# Patient Record
Sex: Female | Born: 1970 | Race: White | Hispanic: No | Marital: Married | Smoking: Never smoker
Health system: Southern US, Community
[De-identification: ages and names within clinical notes are randomized; demographics above are authoritative.]

## PROBLEM LIST (undated history)

## (undated) DIAGNOSIS — M1990 Unspecified osteoarthritis, unspecified site: Secondary | ICD-10-CM

## (undated) DIAGNOSIS — F419 Anxiety disorder, unspecified: Secondary | ICD-10-CM

## (undated) DIAGNOSIS — R569 Unspecified convulsions: Secondary | ICD-10-CM

## (undated) DIAGNOSIS — G8929 Other chronic pain: Secondary | ICD-10-CM

## (undated) DIAGNOSIS — F32A Depression, unspecified: Secondary | ICD-10-CM

## (undated) DIAGNOSIS — K529 Noninfective gastroenteritis and colitis, unspecified: Secondary | ICD-10-CM

## (undated) DIAGNOSIS — G35D Multiple sclerosis, unspecified: Secondary | ICD-10-CM

## (undated) DIAGNOSIS — M797 Fibromyalgia: Secondary | ICD-10-CM

## (undated) DIAGNOSIS — E559 Vitamin D deficiency, unspecified: Secondary | ICD-10-CM

## (undated) DIAGNOSIS — G43909 Migraine, unspecified, not intractable, without status migrainosus: Secondary | ICD-10-CM

## (undated) DIAGNOSIS — M545 Low back pain, unspecified: Secondary | ICD-10-CM

## (undated) DIAGNOSIS — G35 Multiple sclerosis: Secondary | ICD-10-CM

## (undated) DIAGNOSIS — K219 Gastro-esophageal reflux disease without esophagitis: Secondary | ICD-10-CM

## (undated) DIAGNOSIS — F329 Major depressive disorder, single episode, unspecified: Secondary | ICD-10-CM

## (undated) HISTORY — DX: Low back pain: M54.5

## (undated) HISTORY — DX: Vitamin D deficiency, unspecified: E55.9

## (undated) HISTORY — DX: Major depressive disorder, single episode, unspecified: F32.9

## (undated) HISTORY — DX: Anxiety disorder, unspecified: F41.9

## (undated) HISTORY — DX: Noninfective gastroenteritis and colitis, unspecified: K52.9

## (undated) HISTORY — DX: Low back pain, unspecified: M54.50

## (undated) HISTORY — DX: Multiple sclerosis: G35

## (undated) HISTORY — PX: KNEE SURGERY: SHX244

## (undated) HISTORY — DX: Unspecified osteoarthritis, unspecified site: M19.90

## (undated) HISTORY — PX: BACK SURGERY: SHX140

## (undated) HISTORY — DX: Depression, unspecified: F32.A

## (undated) HISTORY — DX: Multiple sclerosis, unspecified: G35.D

## (undated) HISTORY — DX: Other chronic pain: G89.29

## (undated) HISTORY — DX: Migraine, unspecified, not intractable, without status migrainosus: G43.909

## (undated) HISTORY — DX: Unspecified convulsions: R56.9

---

## 2000-12-15 ENCOUNTER — Observation Stay (HOSPITAL_COMMUNITY): Admission: AD | Admit: 2000-12-15 | Discharge: 2000-12-16 | Payer: Self-pay | Admitting: Obstetrics and Gynecology

## 2000-12-28 ENCOUNTER — Observation Stay (HOSPITAL_COMMUNITY): Admission: AD | Admit: 2000-12-28 | Discharge: 2000-12-29 | Payer: Self-pay | Admitting: Obstetrics and Gynecology

## 2001-01-04 ENCOUNTER — Other Ambulatory Visit: Admission: RE | Admit: 2001-01-04 | Discharge: 2001-01-04 | Payer: Self-pay | Admitting: Obstetrics and Gynecology

## 2001-01-08 ENCOUNTER — Ambulatory Visit (HOSPITAL_COMMUNITY): Admission: RE | Admit: 2001-01-08 | Discharge: 2001-01-08 | Payer: Self-pay | Admitting: Obstetrics and Gynecology

## 2001-07-12 ENCOUNTER — Ambulatory Visit (HOSPITAL_COMMUNITY): Admission: RE | Admit: 2001-07-12 | Discharge: 2001-07-12 | Payer: Self-pay | Admitting: Obstetrics and Gynecology

## 2001-07-15 ENCOUNTER — Ambulatory Visit (HOSPITAL_COMMUNITY): Admission: AD | Admit: 2001-07-15 | Discharge: 2001-07-15 | Payer: Self-pay | Admitting: Obstetrics and Gynecology

## 2001-07-19 ENCOUNTER — Ambulatory Visit (HOSPITAL_COMMUNITY): Admission: AD | Admit: 2001-07-19 | Discharge: 2001-07-19 | Payer: Self-pay | Admitting: Obstetrics and Gynecology

## 2001-07-22 ENCOUNTER — Ambulatory Visit (HOSPITAL_COMMUNITY): Admission: RE | Admit: 2001-07-22 | Discharge: 2001-07-22 | Payer: Self-pay | Admitting: Obstetrics and Gynecology

## 2001-07-25 ENCOUNTER — Inpatient Hospital Stay (HOSPITAL_COMMUNITY): Admission: AD | Admit: 2001-07-25 | Discharge: 2001-07-28 | Payer: Self-pay | Admitting: Obstetrics and Gynecology

## 2001-10-11 ENCOUNTER — Ambulatory Visit (HOSPITAL_COMMUNITY): Admission: RE | Admit: 2001-10-11 | Discharge: 2001-10-11 | Payer: Self-pay | Admitting: Obstetrics & Gynecology

## 2003-12-02 ENCOUNTER — Emergency Department (HOSPITAL_COMMUNITY): Admission: EM | Admit: 2003-12-02 | Discharge: 2003-12-02 | Payer: Self-pay | Admitting: Emergency Medicine

## 2003-12-25 ENCOUNTER — Ambulatory Visit (HOSPITAL_COMMUNITY): Admission: RE | Admit: 2003-12-25 | Discharge: 2003-12-25 | Payer: Self-pay | Admitting: Orthopaedic Surgery

## 2006-03-25 ENCOUNTER — Ambulatory Visit (HOSPITAL_COMMUNITY): Admission: RE | Admit: 2006-03-25 | Discharge: 2006-03-25 | Payer: Self-pay | Admitting: Neurology

## 2007-01-20 ENCOUNTER — Ambulatory Visit (HOSPITAL_COMMUNITY): Admission: RE | Admit: 2007-01-20 | Discharge: 2007-01-20 | Payer: Self-pay | Admitting: Neurology

## 2007-01-22 ENCOUNTER — Emergency Department (HOSPITAL_COMMUNITY): Admission: EM | Admit: 2007-01-22 | Discharge: 2007-01-22 | Payer: Self-pay | Admitting: Emergency Medicine

## 2007-07-12 ENCOUNTER — Emergency Department (HOSPITAL_COMMUNITY): Admission: EM | Admit: 2007-07-12 | Discharge: 2007-07-13 | Payer: Self-pay | Admitting: Emergency Medicine

## 2007-07-20 ENCOUNTER — Emergency Department (HOSPITAL_COMMUNITY): Admission: EM | Admit: 2007-07-20 | Discharge: 2007-07-20 | Payer: Self-pay | Admitting: Emergency Medicine

## 2008-07-09 ENCOUNTER — Emergency Department (HOSPITAL_COMMUNITY): Admission: EM | Admit: 2008-07-09 | Discharge: 2008-07-09 | Payer: Self-pay | Admitting: Emergency Medicine

## 2008-11-22 ENCOUNTER — Ambulatory Visit (HOSPITAL_COMMUNITY): Admission: RE | Admit: 2008-11-22 | Discharge: 2008-11-22 | Payer: Self-pay | Admitting: Neurology

## 2009-09-16 ENCOUNTER — Ambulatory Visit (HOSPITAL_COMMUNITY): Admission: RE | Admit: 2009-09-16 | Discharge: 2009-09-16 | Payer: Self-pay | Admitting: Neurology

## 2010-03-12 ENCOUNTER — Encounter: Admission: RE | Admit: 2010-03-12 | Discharge: 2010-03-12 | Payer: Self-pay | Admitting: Neurosurgery

## 2010-05-17 ENCOUNTER — Encounter: Payer: Self-pay | Admitting: Neurosurgery

## 2010-05-19 ENCOUNTER — Encounter: Payer: Self-pay | Admitting: Neurology

## 2010-05-29 ENCOUNTER — Other Ambulatory Visit (HOSPITAL_COMMUNITY): Payer: Self-pay

## 2010-06-02 ENCOUNTER — Inpatient Hospital Stay (HOSPITAL_COMMUNITY)
Admission: RE | Admit: 2010-06-02 | Discharge: 2010-06-04 | DRG: 460 | Disposition: A | Discharge status: Home / Self Care | Payer: Medicaid Other | Source: Ambulatory Visit | Attending: Neurosurgery | Admitting: Neurosurgery

## 2010-06-02 ENCOUNTER — Inpatient Hospital Stay (HOSPITAL_COMMUNITY): Payer: Medicaid Other

## 2010-06-02 ENCOUNTER — Encounter (HOSPITAL_COMMUNITY): Payer: Medicaid Other

## 2010-06-02 DIAGNOSIS — M5137 Other intervertebral disc degeneration, lumbosacral region: Principal | ICD-10-CM | POA: Diagnosis present

## 2010-06-02 DIAGNOSIS — M51379 Other intervertebral disc degeneration, lumbosacral region without mention of lumbar back pain or lower extremity pain: Principal | ICD-10-CM | POA: Diagnosis present

## 2010-06-02 LAB — TYPE AND SCREEN: Antibody Screen: NEGATIVE

## 2010-06-02 LAB — BASIC METABOLIC PANEL
CO2: 28 mEq/L (ref 19–32)
Chloride: 106 mEq/L (ref 96–112)
GFR calc Af Amer: >60 mL/min (ref >60)
Potassium: 4.2 mEq/L (ref 3.5–5.1)
Sodium: 140 mEq/L (ref 135–145)

## 2010-06-02 LAB — SURGICAL PCR SCREEN
MRSA, PCR: NEGATIVE
Staphylococcus aureus: NEGATIVE

## 2010-06-02 LAB — CBC
Hemoglobin: 12.4 g/dL (ref 12.0–15.0)
MCH: 30 pg (ref 26.0–34.0)
RBC: 4.14 MIL/uL (ref 3.87–5.11)

## 2010-06-03 LAB — BASIC METABOLIC PANEL
Calcium: 7.7 mg/dL — ABNORMAL LOW (ref 8.4–10.5)
Creatinine, Ser: 0.71 mg/dL (ref 0.4–1.2)
GFR calc Af Amer: >60 mL/min (ref >60)
GFR calc non Af Amer: >60 mL/min (ref >60)
Glucose, Bld: 104 mg/dL — ABNORMAL HIGH (ref 70–99)
Sodium: 138 mEq/L (ref 135–145)

## 2010-06-03 LAB — CBC
MCHC: 33.7 g/dL (ref 30.0–36.0)
RDW: 13.2 % (ref 11.5–15.5)

## 2010-06-03 NOTE — Op Note (Signed)
Dawn Richard, Dawn Richard                   ACCOUNT NO.:  [id number]  MEDICAL RECORD NO.:  [id number]           PATIENT TYPE:  I  LOCATION:  3022                         FACILITY:  [facility]  PHYSICIAN:  Cristi Loron, M.D.DATE OF BIRTH:  1970-06-13  DATE OF PROCEDURE:  06/02/2010 DATE OF DISCHARGE:                              OPERATIVE REPORT   BRIEF HISTORY:  The patient is a 40 year old white female, who suffered from chronic back pain.  She has failed nonsurgical management.  She was worked up with a lumbar MRI and a lumbar diskogram, which demonstrated the patient had disk degeneration with concordant pain at L4-5.  I discussed the various treatment options with the patient including surgery.  She has weighed the risks, benefits, and alternatives of surgery and desired to proceed with L4-5 anterior lumbar interbody fusion and placement of the interbody prosthesis and anterior instrumentation.  PREOPERATIVE DIAGNOSES: 1. L4-5 degenerative disk disease. 2. Lumbago. 3. Lumbar radiculopathy.  POSTOPERATIVE DIAGNOSES: 1. L4-5 degenerative disk disease. 2. Lumbago. 3. Lumbar radiculopathy.  PROCEDURES: 1. L4-5 anterior lumbar interbody fusion with local morselized     autograft bone and Actifuse bone graft extender with bone     morphogenic protein. 2. Insertion of L4-5 interbody prosthesis (Medtronic PEEK interbody     prosthesis). 3. Anterior screw fixation, L4-5 with Medtronic titanium screws.  SURGEONS:  Cristi Loron, MD and Larina Earthly, MD  ASSISTANT:  Stefani Dama, MD  ANESTHESIA:  General endotracheal.  ESTIMATED BLOOD LOSS:  100 mL.  SPECIMENS:  None.  DRAINS:  None.  COMPLICATIONS:  None.  DESCRIPTION OF PROCEDURE:  The patient was brought to the operating room by the Anesthesia Team.  General endotracheal anesthesia was induced. The patient remained in supine position.  Her abdomen was then prepared with Betadine scrub and Betadine solution.   Sterile drapes were applied.  Dr. Arbie Cookey did the exposure surgery.  For this part of the operation, please refer to his typed operative note.  Once Dr. Arbie Cookey had provided the anterior exposure to the L4-5 intervertebral disk and placed the self-retaining retractors, I took over the operation.  We confirmed that we were indeed at the L4-5 intervertebral disk with intraoperative fluoroscopy.  I then incised the L4-5 intervertebral disk with a 15-blade scalpel.  We performed a partial intervertebral diskectomy with the pituitary forceps and then used the various curettes to clear soft tissue from the vertebral endplates and the lateral recesses and L4-5.  After we were satisfied with the diskectomy, we used trial spacers and determined to use a 12-mm 8 degrees medium prosthesis.  We prefilled the prosthesis with combination of local morselized autograft bone we obtained during the diskectomy as well as Actifuse bone graft extender and bone morphogenic protein-soaked collagen sponges.  We then inserted the prosthesis into the interspace under fluoroscopic guidance.  We then used a drill guide and the awl to create pilot holes one centrally into the L5 interbody and two into the L4 vertebral body.  We then inserted a 20-mm titanium screws through the prosthesis.  We secured the screws and prosthesis,  and tightened them appropriately.  We then again confirmed the good position of this with AP and lateral fluoroscopy.  We then obtained hemostasis with bipolar electrocautery.  We irrigated the wound out with bacitracin solution.  We then removed the retractor. We then reapproximated the patient's anterior rectus sheath with a running 0 Vicryl suture and the subcutaneous tissue with interrupted 2-0 Vicryl suture and the skin with Steri-Strips and Benzoin.  The wound was then coated with bacitracin ointment.  A sterile dressing was applied. The drapes were removed,  and the patient was  subsequently extubated by the Anesthesia Team and transported to the Post Anesthesia Care Unit in stable condition.  All sponge, instrument, and needle counts were correct at the end of this case.     Cristi Loron, M.D.     JDJ/MEDQ  D:  06/02/2010  T:  06/03/2010  Job:  [id number]  Electronically Signed by Tressie Stalker M.D. on 06/03/2010 01:46:24 PM

## 2010-06-04 LAB — BASIC METABOLIC PANEL
BUN: 2 mg/dL — ABNORMAL LOW (ref 6–23)
CO2: 25 mEq/L (ref 19–32)
Chloride: 108 mEq/L (ref 96–112)
GFR calc non Af Amer: >60 mL/min (ref >60)
Glucose, Bld: 110 mg/dL — ABNORMAL HIGH (ref 70–99)
Potassium: 4.4 mEq/L (ref 3.5–5.1)

## 2010-06-09 NOTE — Op Note (Addendum)
  NAMERHESA, Dawn Richard                   ACCOUNT NO.:  [id number]  MEDICAL RECORD NO.:  [id number]           PATIENT TYPE:  I  LOCATION:  3022                         FACILITY:  [facility]  PHYSICIAN:  Larina Earthly, M.D.    DATE OF BIRTH:  [birth year]  DATE OF PROCEDURE:  06/02/2010 DATE OF DISCHARGE:                              OPERATIVE REPORT   PREOPERATIVE DIAGNOSIS:  Lumbar disk disease.  POSTOPERATIVE DIAGNOSIS:  Lumbar disk disease.  PROCEDURE:  Anterior exposure for L4-5 disk fusion which will be dictated as separate note by Dr. Tressie Stalker.  SURGEONS FOR EXPOSURE:  Dr. Arbie Cookey and Dr. Lovell Sheehan.  ANESTHESIA:  General endotracheal.  COMPLICATIONS:  None.  HISTORY OF PRESENT ILLNESS:  The patient is a 40 year old white female with progressive severe L4-5 disk disease.  She was seen preoperatively by Dr. Lovell Sheehan who recommended anterior approach for lumbar fusion.  I discussed this with Dawn Richard and her husband explaining my role inexposure.  I explained mobilization of the intraperitoneal contents, left ureter, iliac vessels.  I also discussed potential injury of these structures.  The patient understands and wished to proceed.  PROCEDURE IN DETAIL:  The patient was taken to the operating room, placed in supine position where the area then was prepped and draped in sterile fashion.  Lateral C-arm projection revealed the level of L4-5 disk and this was marked on the skin.  A incision was made just below the level of the umbilicus from the midline laterally.  The subcutaneous fat was opened in line with a skin incision with electrocautery to expose the anterior rectus sheath.  The anterior rectus sheath was also opened in line with a skin incision.  The rectus muscle was circumferentially mobilized proximally and distally.  The retroperitoneal space was entered bluntly below the level of semilunar line and the intraperitoneal contents were reflected to the right.  The posterior  rectus sheath was opened laterally to give continued mobilization.  The psoas muscle was identified and dissection plane anterior to this was developed.  The left iliac vessels were mobilized to the right.  The iliolumbar vein was identified and had an Jocelynne Duquette branching and was ligated proximally and distally with 2-0 silk sutures and divided.  The blunt dissection plane was continued to be mobilized to mobilize the vessels.  The brow retractor was brought onto the field and the 150 reverse lip blades were placed to the right and left of the L4-5 disk.  Malleable retractors were placed in the [place] tractor for inferior and superior retraction.  After adequate mobilization and placement of tractor, Dr. Lovell Sheehan performed the lumbar surgery which will be dictated as a separate note.     Larina Earthly, M.D.     TFE/MEDQ  D:  06/02/2010  T:  06/03/2010  Job:  [id number]  Electronically Signed by Dalonte Hardage M.D. on 06/09/2010 07:41:40 PM

## 2010-08-07 LAB — COMPREHENSIVE METABOLIC PANEL
ALT: 10 U/L (ref 0–35)
AST: 15 U/L (ref 0–37)
Alkaline Phosphatase: 85 U/L (ref 39–117)
CO2: 31 mEq/L (ref 19–32)
GFR calc Af Amer: >60 mL/min (ref >60)
GFR calc non Af Amer: >60 mL/min (ref >60)
Glucose, Bld: 92 mg/dL (ref 70–99)
Potassium: 2.9 mEq/L — ABNORMAL LOW (ref 3.5–5.1)
Sodium: 138 mEq/L (ref 135–145)
Total Protein: 7.1 g/dL (ref 6.0–8.3)

## 2010-08-07 LAB — DIFFERENTIAL
Basophils Relative: 1 % (ref 0–1)
Eosinophils Absolute: 0 10*3/uL (ref 0.0–0.7)
Eosinophils Relative: 0 % (ref 0–5)
Lymphs Abs: 1.6 10*3/uL (ref 0.7–4.0)
Monocytes Relative: 8 % (ref 3–12)
Neutrophils Relative %: 76 % (ref 43–77)

## 2010-08-07 LAB — URINE MICROSCOPIC-ADD ON

## 2010-08-07 LAB — CBC
Hemoglobin: 13.1 g/dL (ref 12.0–15.0)
RBC: 4.21 MIL/uL (ref 3.87–5.11)
WBC: 10.2 10*3/uL (ref 4.0–10.5)

## 2010-08-07 LAB — URINALYSIS, ROUTINE W REFLEX MICROSCOPIC
Bilirubin Urine: NEGATIVE
Glucose, UA: NEGATIVE mg/dL
Hgb urine dipstick: NEGATIVE
Specific Gravity, Urine: 1.015 (ref 1.005–1.030)
pH: 6.5 (ref 5.0–8.0)

## 2010-09-12 NOTE — Op Note (Signed)
[facility]  Patient:    Dawn Richard, Dawn Richard Visit Number: [id number] MRN: [id number]          Service Type: OBS Location: 4A A427 01 Attending Physician:  Tilda Burrow Dictated by:   Zerita Boers, N.M. Admit Date:  07/25/2001                             Operative Report  DELIVERY SUMMARY  DATE OF DELIVERY:  July 26, 2001  LENGTH OF FIRST-STAGE LABOR:  Fourteen hours.  LENGTH OF SECOND-STAGE LABOR:  Seventeen minutes.  LENGTH OF THIRD-STAGE LABOR:  Thirteen minutes.  DELIVERY TIME:  2:17 a.m. on July 26, 2001.  DELIVERY NOTE:  The patient had a Kiwi-assisted vacuum delivery from a +2/+3 station due to maternal fatigue and inability to push from epidural anesthesia.  Kiwi vacuum was placed on fetal head and Kiwi vacuum pumped up to the green marker and patient was assisted x4 uterine contractions with spontaneously delivery of head.  Upon delivery of head, there was an occult hand and a nuchal cord.  Cord was loosened and infant easily slipped thorough. Upon delivery, infant had good tone, vigorous cry and pinked up quite readily. Cord was clamped and infant placed upon mothers abdomen after being dried. Upon inspection, there was a second degree perineal laceration which was repaired with two interrupted sutures and subcuticular to close with 2-0 Vicryl.  Anesthesia was epidural anesthesia.  Placenta was delivered spontaneously via Schultze mechanism with membranes intact.  Three-vessel cord was noted upon inspection.  Good hemostasis was obtained but due to prolonged Pitocin induction, Hemabate 0.5 ampule was given as prophylaxis to prevent postpartum hemorrhage.  Estimated blood loss was approximately 350 cc.  ADDENDUM:  Approximately one hour prior to delivery, patient began to chill and had an oral temperature of 100.6; at that time, the patient was started on ampicillin 2 g pushes and 1 g q.4h. and I will follow these throughout  the night.  Patient and infant were both stabilized and transferred up to the postpartum unit in good condition. Dictated by:   Zerita Boers, N.M. Attending Physician:  Tilda Burrow DD:  07/26/01 TD:  07/27/01 Job: [id number]

## 2010-09-12 NOTE — Op Note (Signed)
[facility]  Patient:    Dawn Richard, Dawn Richard Visit Number: [id number] MRN: [id number]          Service Type: DSU Location: DAY Attending Physician:  Lazaro Arms Dictated by:   Duane Lope, M.D. Proc. Date: 10/11/01 Admit Date:  10/11/2001                             Operative Report  PREOPERATIVE DIAGNOSIS:  Multiparous female, desires permanent sterilization.  POSTOPERATIVE DIAGNOSIS:  Multiparous female, desires permanent sterilization.  PROCEDURE:  Laparoscopic bilateral tubal ligation using electrocautery.  SURGEON:  Duane Lope, M.D.  ANESTHESIA:  General endotracheal.  FINDINGS:  The patient had a normal pelvis with normal uterus, tubes and ovaries.  DESCRIPTION OF OPERATION:  The patient was taken to the operating room, placed in supine position and underwent general endotracheal anesthesia.  She was then placed in the dorsal lithotomy position, prepped and draped in the usual sterile fashion and a Hulka tenaculum was placed in the uterus for manipulation.  Marcaine 0.5% was injected in the area of the incision.  The incision was made and carried down sharply to the rectus fascia.  The direct-vision pistol-grip trocar was then used and placed into the peritoneal cavity under direct visualization without difficulty.  Peritoneal cavity was insufflated, confirmed with video laparoscope.  Attention was turned to the pelvis.  The fallopian tubes were identified bilaterally.  Electrocautery unit was used and both tubes were burned to no resistance and beyond, a 3.5-cm segment in the distal isthmic and ampullary region of each tube, and again, it was approximately a 3-cm segment bilaterally.  There was good hemostasis.  The rest of the pelvis was normal.  The instruments were removed, the gas was allowed to escape.  The fascia was closed with a single 2-0 Vicryl suture.  The skin was closed using 3-0 Vicryl in a subcuticular fashion.  The patient  tolerated the procedure well, she experienced no blood loss and was taken to the recovery room in good and stable condition.  All counts were correct. Dictated by:   Duane Lope, M.D. Attending Physician:  Lazaro Arms DD:  10/11/01 TD:  10/12/01 Job: [id number]

## 2010-09-12 NOTE — Op Note (Signed)
[facility]  Patient:    Dawn Richard, Dawn Richard Visit Number: [id number] MRN: [id number]          Service Type: OBS Location: 4A A427 01 Attending Physician:  Tilda Burrow Dictated by:   Duane Lope, M.D. Proc. Date: 07/25/01 Admit Date:  07/25/2001                             Operative Report  PROCEDURE:  Epidural catheter placement.  OBSTETRICIAN:  Duane Lope, M.D.  INDICATION:  Patient is in active phase of the first stage of labor.  She is on Pitocin induction.  Her cervix is 5 cm.  She had received two doses of Nubain and was continuing to have a significant amount of pain, she says a level of 7 or 8.  She signed the epidural consult and proceeded.  DESCRIPTION OF PROCEDURE:  I evaluated the spine; there was no infection or trauma to the area of the lumbosacral spine.  I palpated the iliac crest and I found the midline and went one space above to the L2-L3 interspace.  I marked this area, I prepped it and draped, I injected 1% lidocaine as a skin wheal and then introduced a 17-gauge Tuohy needle -- it took three passes -- and found the epidural space with loss-of-resistance technique.  I injected 10 cc of 0.125% bupivacaine plain at 2222.  I then fed the catheter to a depth of 12 cm, which was 5 cm into the epidural space, and it had some difficulty feeding initially, but then fed without difficulty.  An additional 10 cc of 0.125% bupivacaine plain were then pushed through the catheter at 2227; there was no return of blood or CSF.  The epidural was then taped down and the pump was hooked up.  A 7 cc bolus was given of 0.125% bupivacaine with 2 mcg of fentanyl and a continuous infusion of 12 cc/hr was instituted.  At about 20 minutes, the patient had a level of T8 and said her pain was down to a 1 to 2. Patient tolerated the procedure well. Dictated by:   Duane Lope, M.D. Attending Physician:  Tilda Burrow DD:  07/25/01 TD:  07/26/01 Job:  [id number]

## 2010-09-12 NOTE — H&P (Signed)
[facility]  Patient:    Dawn Richard, Dawn Richard Visit Number: [id number] MRN: [id number]          Service Type: DSU Location: DAY Attending Physician:  Lazaro Arms Dictated by:   Duane Lope, M.D. Admit Date:  10/11/2001                           History and Physical  PREOPERATIVE HISTORY AND PHYSICAL  HISTORY OF PRESENT ILLNESS:  The patient is a 40 year old gravida 2, now para 2, who is 10 weeks postpartum from a vaginal delivery, who desires permanent sterilization.  The patient understands the permanent nature of the procedure and the failure rate of 1 in 300 and will proceed.  PAST MEDICAL HISTORY: 1. Mild mitral regurgitation, that does not require antibiotics. 2. Headaches.  PAST SURGICAL HISTORY:  Back injury in September 2000, causing chronic back pain.  PAST OBSTETRICAL HISTORY:  Two vaginal deliveries in 1997 and then again in 2003.  MEDICATIONS:  Celexa.  PHYSICAL EXAMINATION:  VITAL SIGNS:  Blood pressure 110/80, weight is 150 pounds.  HEENT:  Unremarkable.  NECK:  Thyroid is normal.  LUNGS:  Clear.  HEART:  Regular rate and rhythm without murmur, regurgitation or gallop.  BREASTS:  Deferred.  ABDOMEN:  Benign.  GENITALIA:  Pelvic is normal, normal uterus, normal involuted.  Ovaries are normal and nontender.  EXTREMITIES:  Warm with no edema.  NEUROLOGIC EXAM:  Grossly intact.  IMPRESSION:  Multiparous female, desires permanent sterilization.  PLAN:  The patient is admitted for a laparoscopic bilateral tubal ligation using electrocautery.  She understands the risks, benefits, indications, alternatives and will proceed. Dictated by:   Duane Lope, M.D. Attending Physician:  Lazaro Arms DD:  10/10/01 TD:  10/11/01 Job: [id number]

## 2010-09-12 NOTE — H&P (Signed)
[facility]  Patient:    Dawn Richard, Dawn Richard Visit Number: [id number] MRN: [id number]          Service Type: OBS Location: 4A A427 01 Attending Physician:  Tilda Burrow Dictated by:   Duane Lope, M.D. Admit Date:  07/25/2001                           History and Physical  HISTORY OF PRESENT ILLNESS:  Dawn Richard is a 40 year old, white female, G2, P1, AB0 with estimated date of delivery August 01, 2001, currently at [redacted] weeks gestation who is admitted for induction of labor secondary to borderline oligohydramnios and borderline intrauterine growth restriction.  The patients cervix is 1-2, 25% and -3 at the time of admission and there is a reactive NST.  She has been getting twice weekly NSTs which have been reactive.  PAST MEDICAL HISTORY: 1. Mild mitral regurgitation. 2. Headaches.  PAST SURGICAL HISTORY:  Back injury in September 2000, causing chronic back pain.  PAST OBSTETRIC HISTORY:  One vaginal delivery in 1997, 7 pounds 6 ounces.  She had gestational diabetes.  MEDICATIONS: 1. Celexa. 2. Prenatal vitamins.  PHYSICAL EXAMINATION:  VITAL SIGNS:  Blood pressure 110/80, weight 182 pounds.  HEENT:  Unremarkable.  NECK:  Thyroid normal.  LUNGS:  Clear.  HEART:  Regular rate and rhythm without murmurs, rubs or gallops.  BREASTS:  Deferred.  ABDOMEN:  Gravid with fundal height 37 cm.  Cervix is stated as above.  EXTREMITIES:  1+ edema.  NEUROLOGIC:  Grossly intact.  IMPRESSION: 1. Intrauterine pregnancy at [redacted] weeks gestation. 2. Borderline intrauterine growth restriction and oligohydramnios. 3. Bishop score of 5.  PLAN:  The patient is admitted for induction of labor with Pitocin, amniotomy and external fetal monitoring.  She understands the risks, benefits, indications and alternatives and will proceed. Dictated by:   Duane Lope, M.D. Attending Physician:  Tilda Burrow DD:  07/25/01 TD:  07/25/01 Job: [id number]

## 2010-10-14 ENCOUNTER — Ambulatory Visit (HOSPITAL_COMMUNITY)
Admission: RE | Admit: 2010-10-14 | Discharge: 2010-10-14 | Disposition: A | Discharge status: Home / Self Care | Payer: Medicaid Other | Source: Ambulatory Visit | Attending: Neurosurgery | Admitting: Neurosurgery

## 2010-10-14 DIAGNOSIS — M545 Low back pain, unspecified: Secondary | ICD-10-CM | POA: Insufficient documentation

## 2010-10-14 DIAGNOSIS — IMO0001 Reserved for inherently not codable concepts without codable children: Secondary | ICD-10-CM | POA: Insufficient documentation

## 2010-10-14 DIAGNOSIS — R262 Difficulty in walking, not elsewhere classified: Secondary | ICD-10-CM | POA: Insufficient documentation

## 2010-10-14 DIAGNOSIS — M6281 Muscle weakness (generalized): Secondary | ICD-10-CM | POA: Insufficient documentation

## 2010-10-27 ENCOUNTER — Ambulatory Visit (HOSPITAL_COMMUNITY): Payer: Medicaid Other | Admitting: Physical Therapy

## 2011-01-19 LAB — BASIC METABOLIC PANEL
CO2: 24
Calcium: 9.6
Chloride: 102
GFR calc Af Amer: >60
Glucose, Bld: 111 — ABNORMAL HIGH
Sodium: 135

## 2011-01-19 LAB — DIFFERENTIAL
Basophils Absolute: 0.1
Basophils Relative: 1
Eosinophils Absolute: 0.1
Eosinophils Relative: 1
Monocytes Absolute: 0.7
Monocytes Relative: 8

## 2011-01-19 LAB — CBC
HCT: 40.4
Hemoglobin: 14
MCHC: 34.7
MCV: 90.7
RBC: 4.45
RDW: 13

## 2011-01-19 LAB — POCT CARDIAC MARKERS: Myoglobin, poc: 31.3

## 2011-01-19 LAB — URINALYSIS, ROUTINE W REFLEX MICROSCOPIC
Hgb urine dipstick: NEGATIVE
Nitrite: NEGATIVE
Specific Gravity, Urine: 1.02
Urobilinogen, UA: 1
pH: 6.5

## 2011-01-19 LAB — POTASSIUM: Potassium: 3.3 — ABNORMAL LOW

## 2011-01-19 LAB — BLOOD GAS, ARTERIAL
Acid-base deficit: 0.1
Bicarbonate: 23.2
O2 Saturation: 94.8
Patient temperature: 37
TCO2: 20.4
pH, Arterial: 7.463 — ABNORMAL HIGH

## 2011-02-05 LAB — CBC
Hemoglobin: 12.9
RBC: 4.33
WBC: 9.5

## 2011-02-05 LAB — URINALYSIS, ROUTINE W REFLEX MICROSCOPIC
Glucose, UA: NEGATIVE
Hgb urine dipstick: NEGATIVE
Specific Gravity, Urine: 1.015
pH: 7

## 2011-02-05 LAB — BLOOD GAS, ARTERIAL
Acid-base deficit: 1.4
O2 Content: 2
O2 Saturation: 99.3
pO2, Arterial: 163 — ABNORMAL HIGH

## 2011-02-05 LAB — BASIC METABOLIC PANEL
Calcium: 8.9
Chloride: 103
Creatinine, Ser: 0.68
GFR calc Af Amer: >60
Sodium: 137

## 2011-02-05 LAB — DIFFERENTIAL
Lymphs Abs: 2.3
Monocytes Relative: 6
Neutro Abs: 6.1
Neutrophils Relative %: 64

## 2011-02-05 LAB — URINE MICROSCOPIC-ADD ON

## 2012-02-08 ENCOUNTER — Other Ambulatory Visit (HOSPITAL_COMMUNITY): Payer: Self-pay | Admitting: Neurosurgery

## 2012-02-08 DIAGNOSIS — M549 Dorsalgia, unspecified: Secondary | ICD-10-CM

## 2012-02-09 ENCOUNTER — Ambulatory Visit (HOSPITAL_COMMUNITY): Payer: Medicaid Other

## 2012-02-09 ENCOUNTER — Ambulatory Visit (HOSPITAL_COMMUNITY)
Admission: RE | Admit: 2012-02-09 | Discharge: 2012-02-09 | Disposition: A | Discharge status: Home / Self Care | Payer: Medicaid Other | Source: Ambulatory Visit | Attending: Neurosurgery | Admitting: Neurosurgery

## 2012-02-09 DIAGNOSIS — M545 Low back pain, unspecified: Secondary | ICD-10-CM | POA: Insufficient documentation

## 2012-02-09 DIAGNOSIS — R209 Unspecified disturbances of skin sensation: Secondary | ICD-10-CM | POA: Insufficient documentation

## 2012-02-09 DIAGNOSIS — M5126 Other intervertebral disc displacement, lumbar region: Secondary | ICD-10-CM | POA: Insufficient documentation

## 2012-02-09 DIAGNOSIS — M5124 Other intervertebral disc displacement, thoracic region: Secondary | ICD-10-CM | POA: Insufficient documentation

## 2012-02-09 DIAGNOSIS — M549 Dorsalgia, unspecified: Secondary | ICD-10-CM

## 2012-02-09 DIAGNOSIS — M79609 Pain in unspecified limb: Secondary | ICD-10-CM | POA: Insufficient documentation

## 2012-02-09 MED ORDER — GADOBENATE DIMEGLUMINE 529 MG/ML IV SOLN
10.0000 mL | Freq: Once | INTRAVENOUS | Status: AC | PRN
  Administered 2012-02-09: 10 mL via INTRAVENOUS

## 2012-03-08 ENCOUNTER — Other Ambulatory Visit (HOSPITAL_COMMUNITY): Payer: Self-pay | Admitting: Neurosurgery

## 2012-03-08 DIAGNOSIS — M549 Dorsalgia, unspecified: Secondary | ICD-10-CM

## 2012-03-09 ENCOUNTER — Ambulatory Visit (HOSPITAL_COMMUNITY): Admission: RE | Admit: 2012-03-09 | Payer: Medicaid Other | Source: Ambulatory Visit

## 2012-03-15 ENCOUNTER — Ambulatory Visit (HOSPITAL_COMMUNITY)
Admission: RE | Admit: 2012-03-15 | Discharge: 2012-03-15 | Disposition: A | Discharge status: Home / Self Care | Payer: Medicaid Other | Source: Ambulatory Visit | Attending: Neurosurgery | Admitting: Neurosurgery

## 2012-03-15 DIAGNOSIS — M549 Dorsalgia, unspecified: Secondary | ICD-10-CM

## 2012-03-15 DIAGNOSIS — M5124 Other intervertebral disc displacement, thoracic region: Secondary | ICD-10-CM | POA: Insufficient documentation

## 2013-10-31 ENCOUNTER — Encounter: Payer: Self-pay | Admitting: Neurology

## 2013-10-31 ENCOUNTER — Ambulatory Visit: Payer: Medicaid Other | Admitting: Neurology

## 2013-10-31 DIAGNOSIS — K529 Noninfective gastroenteritis and colitis, unspecified: Secondary | ICD-10-CM

## 2013-10-31 DIAGNOSIS — M545 Low back pain, unspecified: Secondary | ICD-10-CM | POA: Insufficient documentation

## 2013-10-31 DIAGNOSIS — R0789 Other chest pain: Secondary | ICD-10-CM

## 2013-10-31 DIAGNOSIS — F419 Anxiety disorder, unspecified: Secondary | ICD-10-CM

## 2013-10-31 DIAGNOSIS — R5383 Other fatigue: Secondary | ICD-10-CM | POA: Insufficient documentation

## 2013-10-31 DIAGNOSIS — M549 Dorsalgia, unspecified: Secondary | ICD-10-CM | POA: Insufficient documentation

## 2013-10-31 DIAGNOSIS — G43909 Migraine, unspecified, not intractable, without status migrainosus: Secondary | ICD-10-CM | POA: Insufficient documentation

## 2013-10-31 DIAGNOSIS — R0781 Pleurodynia: Secondary | ICD-10-CM

## 2013-10-31 DIAGNOSIS — M2550 Pain in unspecified joint: Secondary | ICD-10-CM

## 2013-10-31 DIAGNOSIS — R569 Unspecified convulsions: Secondary | ICD-10-CM

## 2013-10-31 DIAGNOSIS — M1990 Unspecified osteoarthritis, unspecified site: Secondary | ICD-10-CM

## 2013-10-31 DIAGNOSIS — E559 Vitamin D deficiency, unspecified: Secondary | ICD-10-CM

## 2013-11-08 ENCOUNTER — Ambulatory Visit (INDEPENDENT_AMBULATORY_CARE_PROVIDER_SITE_OTHER): Payer: Medicaid Other | Admitting: Neurology

## 2013-11-08 ENCOUNTER — Encounter: Payer: Self-pay | Admitting: Neurology

## 2013-11-08 ENCOUNTER — Encounter (INDEPENDENT_AMBULATORY_CARE_PROVIDER_SITE_OTHER): Payer: Self-pay

## 2013-11-08 VITALS — BP 98/71 | HR 77 | Ht 63.0 in | Wt 164.0 lb

## 2013-11-08 DIAGNOSIS — G35D Multiple sclerosis, unspecified: Secondary | ICD-10-CM | POA: Insufficient documentation

## 2013-11-08 DIAGNOSIS — G35 Multiple sclerosis: Secondary | ICD-10-CM
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
## 2013-11-09 ENCOUNTER — Telehealth: Payer: Self-pay | Admitting: Neurology

## 2013-11-09 LAB — CBC WITH DIFFERENTIAL
BASOS ABS: 0 10*3/uL (ref 0.0–0.2)
BASOS: 1 %
Eos: 7 %
Eosinophils Absolute: 0.6 10*3/uL — ABNORMAL HIGH (ref 0.0–0.4)
HCT: 33.9 % — ABNORMAL LOW (ref 34.0–46.6)
HEMOGLOBIN: 11.5 g/dL (ref 11.1–15.9)
IMMATURE GRANS (ABS): 0 10*3/uL (ref 0.0–0.1)
Immature Granulocytes: 0 %
LYMPHS: 32 %
Lymphocytes Absolute: 2.5 10*3/uL (ref 0.7–3.1)
MCH: 28 pg (ref 26.6–33.0)
MCHC: 33.9 g/dL (ref 31.5–35.7)
MCV: 83 fL (ref 79–97)
Monocytes Absolute: 0.5 10*3/uL (ref 0.1–0.9)
Monocytes: 7 %
NEUTROS PCT: 53 %
Neutrophils Absolute: 4.1 10*3/uL (ref 1.4–7.0)
Platelets: 343 10*3/uL (ref 150–379)
RBC: 4.11 x10E6/uL (ref 3.77–5.28)
RDW: 13.9 % (ref 12.3–15.4)
WBC: 7.8 10*3/uL (ref 3.4–10.8)

## 2013-11-09 LAB — COMPREHENSIVE METABOLIC PANEL
ALT: 11 IU/L (ref 0–32)
AST: 14 IU/L (ref 0–40)
Albumin/Globulin Ratio: 1.4 (ref 1.1–2.5)
Albumin: 3.9 g/dL (ref 3.5–5.5)
Alkaline Phosphatase: 110 IU/L (ref 39–117)
BUN/Creatinine Ratio: 14 (ref 9–23)
BUN: 11 mg/dL (ref 6–24)
CHLORIDE: 99 mmol/L (ref 97–108)
CO2: 25 mmol/L (ref 18–29)
Calcium: 9.5 mg/dL (ref 8.7–10.2)
Creatinine, Ser: 0.76 mg/dL (ref 0.57–1.00)
GFR calc non Af Amer: 98 mL/min/{1.73_m2} (ref >59)
GFR, EST AFRICAN AMERICAN: 113 mL/min/{1.73_m2} (ref >59)
Globulin, Total: 2.8 g/dL (ref 1.5–4.5)
Glucose: 76 mg/dL (ref 65–99)
POTASSIUM: 4.5 mmol/L (ref 3.5–5.2)
Sodium: 139 mmol/L (ref 134–144)
Total Bilirubin: <0.2 mg/dL (ref 0.0–1.2)
Total Protein: 6.7 g/dL (ref 6.0–8.5)

## 2013-11-09 LAB — TSH: TSH: 7.96 u[IU]/mL — ABNORMAL HIGH (ref 0.450–4.500)

## 2013-11-09 LAB — VITAMIN B12: Vitamin B-12: 821 pg/mL (ref 211–946)

## 2013-11-09 LAB — VARICELLA ZOSTER ANTIBODY, IGG: VARICELLA: 1858 {index} (ref >165)

## 2013-11-09 NOTE — Telephone Encounter (Signed)
Pt called back and I informed her per Dr. Terrace ArabiaYan that the pt's lab work results showed elevated TSH and this could indicate hypothyroidism and that Dr. Terrace ArabiaYan has faxed pt's laboratory evaluation to pt PCP Sherryll BurgerShah and pt should continue to f/u with PCP concerning this issue and that the rest of the lab work results were normal. I advised the pt that if she has any other problems, questions or concerns to call the office. Pt verbalized understanding.

## 2013-11-09 NOTE — Telephone Encounter (Signed)
Called pt and left message informing pt to call our office back to get lab results.

## 2013-11-09 NOTE — Telephone Encounter (Signed)
Please call patient, lab showed elevated TSH could indicate hypothyroidism, I have faxed her laboratory evaluation to her primary care doctor Sherryll Burger, she should continue follow up with her primary care concerning this issue, rest of the laboratory evaluation was normal

## 2013-11-14 ENCOUNTER — Telehealth: Payer: Self-pay | Admitting: Radiology

## 2013-11-14 ENCOUNTER — Other Ambulatory Visit: Payer: Medicaid Other | Admitting: Radiology

## 2013-11-14 NOTE — Telephone Encounter (Signed)
No show for EEG 

## 2013-12-08 ENCOUNTER — Ambulatory Visit
Admission: RE | Admit: 2013-12-08 | Discharge: 2013-12-08 | Disposition: A | Discharge status: Home / Self Care | Payer: Medicaid Other | Source: Ambulatory Visit | Attending: Neurology | Admitting: Neurology

## 2013-12-08 DIAGNOSIS — G35 Multiple sclerosis: Secondary | ICD-10-CM

## 2013-12-08 MED ORDER — GADOBENATE DIMEGLUMINE 529 MG/ML IV SOLN
15.0000 mL | Freq: Once | INTRAVENOUS | Status: AC | PRN
  Administered 2013-12-08: 15 mL via INTRAVENOUS

## 2013-12-12 NOTE — Progress Notes (Signed)
Quick Note:  WID- Dr.Yan is out of the office until 12-14-2013. ______ 

## 2013-12-15 ENCOUNTER — Ambulatory Visit: Payer: Medicaid Other | Admitting: Neurology

## 2014-06-05 ENCOUNTER — Encounter: Payer: Self-pay | Admitting: Neurology

## 2014-06-05 ENCOUNTER — Ambulatory Visit (INDEPENDENT_AMBULATORY_CARE_PROVIDER_SITE_OTHER): Payer: Medicaid Other | Admitting: Neurology

## 2014-06-05 VITALS — BP 131/93 | HR 75 | Ht 63.0 in | Wt 156.0 lb

## 2014-06-05 DIAGNOSIS — R569 Unspecified convulsions: Secondary | ICD-10-CM

## 2014-06-05 DIAGNOSIS — G43009 Migraine without aura, not intractable, without status migrainosus: Secondary | ICD-10-CM

## 2014-06-05 MED ORDER — RIZATRIPTAN BENZOATE 10 MG PO TBDP: 10.0000 mg | ORAL_TABLET | ORAL | Status: DC | PRN

## 2014-06-05 MED ORDER — LAMOTRIGINE 100 MG PO TABS: ORAL_TABLET | ORAL | Status: DC
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
## 2014-06-05 NOTE — Addendum Note (Signed)
Addended by: Levert Feinstein on: 06/05/2014 12:28 PM   Modules accepted: Orders

## 2014-06-12 ENCOUNTER — Other Ambulatory Visit: Payer: Medicaid Other

## 2014-07-11 ENCOUNTER — Telehealth: Payer: Self-pay | Admitting: *Deleted

## 2014-07-11 ENCOUNTER — Ambulatory Visit: Payer: Medicaid Other | Admitting: Neurology

## 2014-07-11 NOTE — Telephone Encounter (Signed)
No showed appointment.

## 2014-07-11 NOTE — Telephone Encounter (Signed)
Multiple no show history for appts and tests. - dismiss per protocol.

## 2014-07-13 ENCOUNTER — Encounter: Payer: Self-pay | Admitting: Neurology

## 2014-10-01 ENCOUNTER — Other Ambulatory Visit: Payer: Self-pay | Admitting: Neurology

## 2014-10-02 NOTE — Telephone Encounter (Signed)
Patient was dismissed 07/13/14

## 2016-02-09 ENCOUNTER — Emergency Department (HOSPITAL_COMMUNITY)
Admission: EM | Admit: 2016-02-09 | Discharge: 2016-02-09 | Disposition: A | Discharge status: Home / Self Care | Payer: Medicaid Other | Attending: Emergency Medicine | Admitting: Emergency Medicine

## 2016-02-09 ENCOUNTER — Emergency Department (HOSPITAL_COMMUNITY): Payer: Medicaid Other

## 2016-02-09 ENCOUNTER — Encounter (HOSPITAL_COMMUNITY): Payer: Self-pay | Admitting: *Deleted

## 2016-02-09 DIAGNOSIS — L03011 Cellulitis of right finger: Secondary | ICD-10-CM

## 2016-02-09 DIAGNOSIS — Y929 Unspecified place or not applicable: Secondary | ICD-10-CM | POA: Diagnosis not present

## 2016-02-09 DIAGNOSIS — W5651XA Bitten by other fish, initial encounter: Secondary | ICD-10-CM | POA: Insufficient documentation

## 2016-02-09 DIAGNOSIS — S6991XA Unspecified injury of right wrist, hand and finger(s), initial encounter: Secondary | ICD-10-CM | POA: Diagnosis present

## 2016-02-09 DIAGNOSIS — Y9389 Activity, other specified: Secondary | ICD-10-CM | POA: Diagnosis not present

## 2016-02-09 DIAGNOSIS — Y999 Unspecified external cause status: Secondary | ICD-10-CM | POA: Diagnosis not present

## 2016-02-09 HISTORY — DX: Fibromyalgia: M79.7

## 2016-02-09 MED ORDER — ACETAMINOPHEN-CODEINE #3 300-30 MG PO TABS
2.0000 | ORAL_TABLET | Freq: Once | ORAL | Status: AC
  Administered 2016-02-09: 2 via ORAL
  Filled 2016-02-09: qty 2

## 2016-02-09 MED ORDER — AMOXICILLIN-POT CLAVULANATE 875-125 MG PO TABS: 1.0000 | ORAL_TABLET | Freq: Two times a day (BID) | ORAL | 0 refills | Status: DC

## 2016-02-09 MED ORDER — AMOXICILLIN-POT CLAVULANATE 875-125 MG PO TABS
1.0000 | ORAL_TABLET | Freq: Once | ORAL | Status: AC
  Administered 2016-02-09: 1 via ORAL
  Filled 2016-02-09: qty 1

## 2016-02-09 MED ORDER — IBUPROFEN 800 MG PO TABS
800.0000 mg | ORAL_TABLET | Freq: Once | ORAL | Status: AC
  Administered 2016-02-09: 800 mg via ORAL
  Filled 2016-02-09: qty 1

## 2016-02-09 MED ORDER — LIDOCAINE HCL (PF) 2 % IJ SOLN
10.0000 mL | Freq: Once | INTRAMUSCULAR | Status: AC
  Administered 2016-02-09: 10 mL
  Filled 2016-02-09: qty 10

## 2016-02-09 MED ORDER — DICLOFENAC SODIUM 75 MG PO TBEC: 75.0000 mg | DELAYED_RELEASE_TABLET | Freq: Two times a day (BID) | ORAL | 0 refills | Status: DC

## 2016-02-09 MED ORDER — ACETAMINOPHEN-CODEINE #3 300-30 MG PO TABS: 1.0000 | ORAL_TABLET | Freq: Four times a day (QID) | ORAL | 0 refills | Status: DC | PRN

## 2016-02-09 NOTE — Discharge Instructions (Signed)
Please cleanse the wound daily with soap and water. Fresh bandage on it daily. Use diclofenac and Augmentin daily with a meal. Use Tylenol codeine as needed every 6 hours for pain. This medication Lenger cause drowsiness, please do not drive, drink alcohol, operate machinery, when taking this medication.

## 2016-02-09 NOTE — ED Provider Notes (Signed)
[facility] DEPT Provider Note   CSN: [id number] Arrival date & time: 02/09/16  1545  By signing my name below, I, Vista Mink, attest that this documentation has been prepared under the direction and in the presence of [employer].  Electronically Signed: Vista Mink, ED Scribe. 02/09/16. 5:06 PM.  History   Chief Complaint Chief Complaint  Patient presents with  . Hand Injury    HPI HPI Comments: Dawn Richard is a 45 y.o. female who presents to the Emergency Department complaining of gradually worsening, shooting right thumb pain, discoloration and swelling that started four days ago. Pt states that she was fishing when a catfish barb stung her right thumb and has increased pain and swelling since then. She also reports decreased sensation at the distal right thumb. Pt is able to move the finger and wrist but with increased pain. She denies any other injuries.  The history is provided by the patient. No language interpreter was used.    Past Medical History:  Diagnosis Date  . Anxiety   . Arthritis   . Back pain   . Depression   . Fatigue   . Fibromyalgia   . Gastroenteritis   . Joint pain   . Low back pain   . Migraine   . MS (multiple sclerosis) (HCC)   . Neck pain   . Rib pain on left side   . Seizure (HCC)   . Seizure (HCC)   . Vitamin D deficiency     Patient Active Problem List   Diagnosis Date Noted  . MS (multiple sclerosis) (HCC)   . Fatigue   . Gastroenteritis   . Low back pain   . Seizure (HCC)   . Anxiety   . Back pain   . Migraine   . Joint pain   . Rib pain on left side   . Vitamin D deficiency   . Arthritis     Past Surgical History:  Procedure Laterality Date  . BACK SURGERY    . KNEE SURGERY Left     OB History    No data available       Home Medications    Prior to Admission medications   Medication Sig Start Date End Date Taking? Authorizing Provider  Diclofenac Potassium (CAMBIA) 50 MG PACK Take by mouth as  directed.    Historical Provider, MD  DULoxetine (CYMBALTA) 30 MG capsule Take 30 mg by mouth 2 (two) times daily.    Historical Provider, MD  lamoTRIgine (LAMICTAL) 100 MG tablet 1/2 tab po bid xone week, then one tab po bid 06/05/14   Levert Feinstein, MD  levETIRAcetam (KEPPRA) 750 MG tablet Take 750 mg by mouth 2 (two) times daily.    Historical Provider, MD  nabumetone (RELAFEN) 750 MG tablet Take 750 mg by mouth 2 (two) times daily.    Historical Provider, MD  omeprazole (PRILOSEC) 20 MG capsule Take 20 mg by mouth daily.    Historical Provider, MD  rizatriptan (MAXALT-MLT) 10 MG disintegrating tablet Take 1 tablet (10 mg total) by mouth as needed. Marlette repeat in 2 hours if needed 06/05/14   Levert Feinstein, MD  tiZANidine (ZANAFLEX) 4 MG capsule Take 4 mg by mouth 3 (three) times daily.    Historical Provider, MD    Family History Family History  Problem Relation Age of Onset  . High blood pressure Mother   . High Cholesterol Mother   . Heart disease Father   . High blood pressure  Father     Social History Social History  Substance Use Topics  . Smoking status: Never Smoker  . Smokeless tobacco: Never Used  . Alcohol use 0.6 oz/week    1 Cans of beer per week     Comment: OCC   Allergies   Bee venom  Review of Systems Review of Systems  Constitutional: Negative for fever.  Musculoskeletal: Positive for arthralgias (right thumb).  Neurological: Positive for numbness (decreased sensation to distal thumb).  All other systems reviewed and are negative.   Physical Exam Updated Vital Signs BP 107/67 (BP Location: Left Arm)   Pulse 75   Temp 98.4 F (36.9 C) (Oral)   Resp 18   Ht 5\' 2"  (1.575 m)   Wt 164 lb (74.4 kg)   LMP 02/11/2012   SpO2 100%   BMI 30.00 kg/m   Physical Exam  Constitutional: She is oriented to person, place, and time. She appears well-developed and well-nourished. No distress.  HENT:  Head: Normocephalic and atraumatic.  Neck: Normal range of motion.    Pulmonary/Chest: Effort normal.  Musculoskeletal: She exhibits tenderness.  No palapble nodes on bicep tricep area. full rom of wrist but with pain. pain at the mp of the index finger and thumb. swelling of distal thumb with a bruise-type discoloration. decreased sensation of the distal right thumb. Full ROM of the elbow and no effusion of the elbow.   Neurological: She is alert and oriented to person, place, and time.  Skin: Skin is warm and dry. She is not diaphoretic.  Psychiatric: She has a normal mood and affect. Judgment normal.  Nursing note and vitals reviewed.   ED Treatments / Results  DIAGNOSTIC STUDIES: Oxygen Saturation is 100% on RA, normal by my interpretation.  COORDINATION OF CARE: 5:05 PM-Will order I&D. Discussed treatment plan with pt at bedside and pt agreed to plan.   Labs (all labs ordered are listed, but only abnormal results are displayed) Labs Reviewed - No data to display  EKG  EKG Interpretation None       Radiology Dg Finger Thumb Right  Result Date: 02/09/2016 CLINICAL DATA:  Right thumb pain since Thursday night EXAM: RIGHT THUMB 2+V COMPARISON:  None. FINDINGS: There is no evidence of fracture or dislocation. There is no evidence of arthropathy or other focal bone abnormality. Soft tissues are unremarkable IMPRESSION: No acute osseous injury of the right thumb. Electronically Signed   By: Elige Ko   On: 02/09/2016 16:21    Procedures .Marland KitchenIncision and Drainage Date/Time: 02/09/2016 6:16 PM Performed by: Ivery Quale Authorized by: Ivery Quale   Consent:    Consent obtained:  Verbal   Consent given by:  Patient   Risks discussed:  Bleeding, pain and infection Location:    Indications for incision and drainage: paronychia.   Location:  Upper extremity   Upper extremity location:  Finger   Finger location:  R thumb Pre-procedure details:    Skin preparation:  Betadine Procedure type:    Complexity:  Simple Procedure details:     Incision types:  Single straight   Incision depth:  Dermal   Scalpel blade:  11   Wound management:  Probed and deloculated   Drainage:  Bloody   Drainage amount:  Scant   Wound treatment:  Wound left open Post-procedure details:    Patient tolerance of procedure:  Tolerated well, no immediate complications   (including critical care time)  Medications Ordered in ED Medications - No data to display  Initial Impression / Assessment and Plan / ED Course  I have reviewed the triage vital signs and the nursing notes.  Pertinent labs & imaging results that were available during my care of the patient were reviewed by me and considered in my medical decision making (see chart for details).  Clinical Course    **I have reviewed nursing notes, vital signs, and all appropriate lab and imaging results for this patient.*  Final Clinical Impressions(s) / ED Diagnoses  Patient received a stick and staying by a catfish barb. She has swelling and inflammation involving the distal right thumb. Incision and drainage carried out with removal small amount of material on. Patient will be treated with Augmentin, diclofenac, and Tylenol codeine. Patient is to return if any changes, problems, or concerns.    Final diagnoses:  None    New Prescriptions New Prescriptions   No medications on file  **I personally performed the services described in this documentation, which was scribed in my presence. The recorded information has been reviewed and is accurate.Ivery Quale*    Deshawnda Acrey, PA-C 02/09/16 1823    Samuel JesterKathleen McManus, DO 02/11/16 2023

## 2016-02-09 NOTE — ED Triage Notes (Signed)
Pt states she was fishing Thursday when a catfish barb stung her right hand thumb. Pt states pain and swelling has increased since then.

## 2016-02-11 ENCOUNTER — Encounter (HOSPITAL_COMMUNITY): Payer: Self-pay | Admitting: Emergency Medicine

## 2016-02-11 ENCOUNTER — Emergency Department (HOSPITAL_COMMUNITY)
Admission: EM | Admit: 2016-02-11 | Discharge: 2016-02-12 | Disposition: A | Discharge status: Home / Self Care | Payer: Medicaid Other | Attending: Emergency Medicine | Admitting: Emergency Medicine

## 2016-02-11 DIAGNOSIS — R197 Diarrhea, unspecified: Secondary | ICD-10-CM | POA: Insufficient documentation

## 2016-02-11 DIAGNOSIS — R112 Nausea with vomiting, unspecified: Secondary | ICD-10-CM | POA: Insufficient documentation

## 2016-02-11 DIAGNOSIS — Z79899 Other long term (current) drug therapy: Secondary | ICD-10-CM | POA: Diagnosis not present

## 2016-02-11 DIAGNOSIS — L02511 Cutaneous abscess of right hand: Secondary | ICD-10-CM | POA: Diagnosis not present

## 2016-02-11 LAB — CBC
HCT: 40.4 % (ref 36.0–46.0)
HEMOGLOBIN: 13.1 g/dL (ref 12.0–15.0)
MCH: 27.9 pg (ref 26.0–34.0)
MCHC: 32.4 g/dL (ref 30.0–36.0)
MCV: 86 fL (ref 78.0–100.0)
Platelets: 335 10*3/uL (ref 150–400)
RBC: 4.7 MIL/uL (ref 3.87–5.11)
RDW: 14.4 % (ref 11.5–15.5)
WBC: 10.5 10*3/uL (ref 4.0–10.5)

## 2016-02-11 LAB — COMPREHENSIVE METABOLIC PANEL
ALBUMIN: 4.3 g/dL (ref 3.5–5.0)
ALK PHOS: 128 U/L — AB (ref 38–126)
ALT: 10 U/L — AB (ref 14–54)
ANION GAP: 9 (ref 5–15)
AST: 17 U/L (ref 15–41)
BUN: 5 mg/dL — AB (ref 6–20)
CALCIUM: 9.7 mg/dL (ref 8.9–10.3)
CO2: 24 mmol/L (ref 22–32)
Chloride: 106 mmol/L (ref 101–111)
Creatinine, Ser: 0.66 mg/dL (ref 0.44–1.00)
GFR calc Af Amer: >60 mL/min (ref >60)
GFR calc non Af Amer: >60 mL/min (ref >60)
GLUCOSE: 103 mg/dL — AB (ref 65–99)
Potassium: 3.4 mmol/L — ABNORMAL LOW (ref 3.5–5.1)
SODIUM: 139 mmol/L (ref 135–145)
Total Bilirubin: 0.7 mg/dL (ref 0.3–1.2)
Total Protein: 8.2 g/dL — ABNORMAL HIGH (ref 6.5–8.1)

## 2016-02-11 LAB — LIPASE, BLOOD: Lipase: 13 U/L (ref 11–51)

## 2016-02-11 LAB — URINALYSIS, ROUTINE W REFLEX MICROSCOPIC
BILIRUBIN URINE: NEGATIVE
Glucose, UA: NEGATIVE mg/dL
HGB URINE DIPSTICK: NEGATIVE
Ketones, ur: NEGATIVE mg/dL
Nitrite: NEGATIVE
PH: 6 (ref 5.0–8.0)
SPECIFIC GRAVITY, URINE: 1.02 (ref 1.005–1.030)

## 2016-02-11 LAB — URINE MICROSCOPIC-ADD ON: RBC / HPF: NONE SEEN RBC/hpf (ref 0–5)

## 2016-02-11 MED ORDER — LIDOCAINE HCL (PF) 2 % IJ SOLN
10.0000 mL | Freq: Once | INTRAMUSCULAR | Status: DC
  Filled 2016-02-11: qty 10

## 2016-02-11 MED ORDER — SODIUM CHLORIDE 0.9 % IV BOLUS (SEPSIS)
1000.0000 mL | Freq: Once | INTRAVENOUS | Status: AC
  Administered 2016-02-11: 1000 mL via INTRAVENOUS

## 2016-02-11 MED ORDER — ONDANSETRON HCL 4 MG/2ML IJ SOLN
4.0000 mg | Freq: Once | INTRAMUSCULAR | Status: AC
  Administered 2016-02-11: 4 mg via INTRAVENOUS
  Filled 2016-02-11: qty 2

## 2016-02-11 MED ORDER — PROMETHAZINE HCL 25 MG PO TABS: 25.0000 mg | ORAL_TABLET | Freq: Four times a day (QID) | ORAL | 0 refills | Status: DC | PRN

## 2016-02-11 MED ORDER — DOXYCYCLINE HYCLATE 100 MG PO CAPS: 100.0000 mg | ORAL_CAPSULE | Freq: Two times a day (BID) | ORAL | 0 refills | Status: DC

## 2016-02-11 MED ORDER — ONDANSETRON HCL 4 MG/2ML IJ SOLN
4.0000 mg | Freq: Once | INTRAMUSCULAR | Status: AC
Start: 2016-02-11 — End: 2016-02-11
  Administered 2016-02-11: 4 mg via INTRAVENOUS
  Filled 2016-02-11: qty 2

## 2016-02-11 MED ORDER — ONDANSETRON HCL 4 MG/2ML IJ SOLN: 4.0000 mg | Freq: Once | INTRAMUSCULAR | Status: DC

## 2016-02-11 NOTE — ED Notes (Signed)
Dr in with Pt

## 2016-02-11 NOTE — ED Notes (Signed)
Pt reports she was given an antibx for finger, took one tablet and then began v/ 2 hours later. Reports 2 episodes of D/ and >5 episodes of V/. Unable to keep food/fluids down.

## 2016-02-11 NOTE — ED Provider Notes (Signed)
[facility] Provider Note   CSN: [id number] Arrival date & time: 02/11/16  1827     History   Chief Complaint Chief Complaint  Patient presents with  . Emesis    HPI Dawn Richard is a 45 y.o. female.  Patient seen in the ED on 02/09/16 for a paronychia of her right thumb. A partial removal of her nail was performed. She returns today with an increasing infection in her thumb and inability to keep fluids down. She has had nausea, vomiting, diarrhea. She was prescribed Augmentin for her thumb. No fever, sweats, chills.      Past Medical History:  Diagnosis Date  . Anxiety   . Arthritis   . Back pain   . Depression   . Fatigue   . Fibromyalgia   . Gastroenteritis   . Joint pain   . Low back pain   . Migraine   . MS (multiple sclerosis) (HCC)   . Neck pain   . Rib pain on left side   . Seizure (HCC)   . Seizure (HCC)   . Vitamin D deficiency     Patient Active Problem List   Diagnosis Date Noted  . MS (multiple sclerosis) (HCC)   . Fatigue   . Gastroenteritis   . Low back pain   . Seizure (HCC)   . Anxiety   . Back pain   . Migraine   . Joint pain   . Rib pain on left side   . Vitamin D deficiency   . Arthritis     Past Surgical History:  Procedure Laterality Date  . BACK SURGERY    . KNEE SURGERY Left     OB History    Gravida Para Term Preterm AB Living   2 2 2          SAB TAB Ectopic Multiple Live Births                   Home Medications    Prior to Admission medications   Medication Sig Start Date End Date Taking? Authorizing Provider  acetaminophen-codeine (TYLENOL #3) 300-30 MG tablet Take 1-2 tablets by mouth every 6 (six) hours as needed for moderate pain. 02/09/16  Yes Ivery Quale, PA-C  amoxicillin-clavulanate (AUGMENTIN) 875-125 MG tablet Take 1 tablet by mouth every 12 (twelve) hours. 02/09/16  Yes Ivery Quale, PA-C  clonazePAM (KLONOPIN) 0.5 MG tablet Take 1 tablet by mouth 3 (three) times daily. 01/15/16  Yes  Historical Provider, MD  diclofenac (VOLTAREN) 75 MG EC tablet Take 1 tablet (75 mg total) by mouth 2 (two) times daily. 02/09/16  Yes Ivery Quale, PA-C  DULoxetine (CYMBALTA) 30 MG capsule Take 30 mg by mouth 2 (two) times daily.   Yes Historical Provider, MD  folic acid (FOLVITE) 1 MG tablet Take 1 mg by mouth daily. 02/06/16  Yes Historical Provider, MD  gabapentin (NEURONTIN) 400 MG capsule Take 800 mg by mouth 4 (four) times daily. 12/06/15  Yes Historical Provider, MD  levETIRAcetam (KEPPRA) 750 MG tablet Take 750 mg by mouth 2 (two) times daily.   Yes Historical Provider, MD  methocarbamol (ROBAXIN) 750 MG tablet Take 750 mg by mouth 4 (four) times daily. 02/10/16  Yes Historical Provider, MD  nabumetone (RELAFEN) 750 MG tablet Take 750 mg by mouth 2 (two) times daily.   Yes Historical Provider, MD  omeprazole (PRILOSEC) 20 MG capsule Take 20 mg by mouth daily.   Yes Historical Provider, MD  oxyCODONE-acetaminophen (PERCOCET) [phone]  MG tablet Take 1 tablet by mouth every 6 (six) hours. 01/10/16  Yes Historical Provider, MD  tiZANidine (ZANAFLEX) 4 MG capsule Take 4 mg by mouth at bedtime.    Yes Historical Provider, MD  doxycycline (VIBRAMYCIN) 100 MG capsule Take 1 capsule (100 mg total) by mouth 2 (two) times daily. 02/11/16   Donnetta HutchingBrian Aamirah Salmi, MD  promethazine (PHENERGAN) 25 MG tablet Take 1 tablet (25 mg total) by mouth every 6 (six) hours as needed. 02/11/16   Donnetta HutchingBrian Lincy Belles, MD    Family History Family History  Problem Relation Age of Onset  . High blood pressure Mother   . High Cholesterol Mother   . Heart disease Father   . High blood pressure Father     Social History Social History  Substance Use Topics  . Smoking status: Never Smoker  . Smokeless tobacco: Never Used  . Alcohol use 0.6 oz/week    1 Cans of beer per week     Comment: OCC     Allergies   Bee venom   Review of Systems Review of Systems  All other systems reviewed and are negative.    Physical  Exam Updated Vital Signs BP 118/70 (BP Location: Left Arm)   Pulse 96   Temp 98.5 F (36.9 C) (Oral)   Resp 16   Ht 5\' 2"  (1.575 m)   Wt 164 lb (74.4 kg)   LMP 02/11/2012   SpO2 100%   BMI 30.00 kg/m   Physical Exam  Constitutional: She is oriented to person, place, and time.  Pale and dehydrated appearing  HENT:  Head: Normocephalic and atraumatic.  Eyes: Conjunctivae are normal.  Neck: Neck supple.  Cardiovascular: Normal rate and regular rhythm.   Pulmonary/Chest: Effort normal and breath sounds normal.  Abdominal: Soft. Bowel sounds are normal.  Musculoskeletal: Normal range of motion.  Neurological: She is alert and oriented to person, place, and time.  Skin:  Right thumb: Pus pocket on distal aspect of soft tissue of thumb  Psychiatric: She has a normal mood and affect. Her behavior is normal.  Nursing note and vitals reviewed.    ED Treatments / Results  Labs (all labs ordered are listed, but only abnormal results are displayed) Labs Reviewed  COMPREHENSIVE METABOLIC PANEL - Abnormal; Notable for the following:       Result Value   Potassium 3.4 (*)    Glucose, Bld 103 (*)    BUN 5 (*)    Total Protein 8.2 (*)    ALT 10 (*)    Alkaline Phosphatase 128 (*)    All other components within normal limits  URINALYSIS, ROUTINE W REFLEX MICROSCOPIC (NOT [facility]) - Abnormal; Notable for the following:    Protein, ur TRACE (*)    Leukocytes, UA SMALL (*)    All other components within normal limits  URINE MICROSCOPIC-ADD ON - Abnormal; Notable for the following:    Squamous Epithelial / LPF TOO NUMEROUS TO COUNT (*)    Bacteria, UA FEW (*)    All other components within normal limits  LIPASE, BLOOD  CBC    EKG  EKG Interpretation None       Radiology No results found.  Procedures .Marland Kitchen.Incision and Drainage Date/Time: 02/11/2016 11:53 PM Performed by: Donnetta HutchingOOK, Wrenn Willcox Authorized by: Donnetta HutchingOOK, Angellina Ferdinand   Consent:    Consent obtained:  Verbal   Consent given by:   Patient   Risks discussed:  Pain Location:    Type:  Abscess   Location:  Upper extremity   Upper extremity location:  Finger   Finger location:  R thumb Pre-procedure details:    Skin preparation:  Betadine Anesthesia (see MAR for exact dosages):    Anesthesia method:  Local infiltration   Local anesthetic:  Lidocaine 2% w/o epi Procedure type:    Complexity:  Simple Procedure details:    Incision types:  Single straight   Scalpel blade:  11   Wound management:  Probed and deloculated   Drainage:  Purulent   Wound treatment:  Wound left open   Packing materials:  None Post-procedure details:    Patient tolerance of procedure:  Tolerated well, no immediate complications   (including critical care time)  Medications Ordered in ED Medications  lidocaine (XYLOCAINE) 2 % injection 10 mL (not administered)  ondansetron (ZOFRAN) injection 4 mg (4 mg Intravenous Given 02/11/16 2010)  sodium chloride 0.9 % bolus 1,000 mL (0 mLs Intravenous Stopped 02/11/16 2140)  sodium chloride 0.9 % bolus 1,000 mL (0 mLs Intravenous Stopped 02/11/16 2237)  sodium chloride 0.9 % bolus 1,000 mL (1,000 mLs Intravenous New Bag/Given 02/11/16 2246)  ondansetron (ZOFRAN) injection 4 mg (4 mg Intravenous Given 02/11/16 2246)     Initial Impression / Assessment and Plan / ED Course  I have reviewed the triage vital signs and the nursing notes.  Pertinent labs & imaging results that were available during my care of the patient were reviewed by me and considered in my medical decision making (see chart for details).  Clinical Course    Patient was given 3 L of IV fluids which seemed to help her dehydration. Additionally an incision and drainage was performed in the right thumb with good result. Augmentin was discontinued. Discharge medications doxycycline 100 mg and Phenergan 25 mg.  Patient understands to return if worse.  Final Clinical Impressions(s) / ED Diagnoses   Final diagnoses:  Nausea  vomiting and diarrhea  Abscess of right thumb    New Prescriptions New Prescriptions   DOXYCYCLINE (VIBRAMYCIN) 100 MG CAPSULE    Take 1 capsule (100 mg total) by mouth 2 (two) times daily.   PROMETHAZINE (PHENERGAN) 25 MG TABLET    Take 1 tablet (25 mg total) by mouth every 6 (six) hours as needed.     Donnetta Hutching, MD 02/11/16 2356

## 2016-02-11 NOTE — ED Triage Notes (Signed)
Pt reports emesis that started last night. Pt states she has been unable to keep fluids or foods down.

## 2016-02-11 NOTE — Discharge Instructions (Signed)
Soak your thumb in hot salt water 2 or 3 times a day. Stop the only antibiotic and start the new antibiotic. Medication for nausea.

## 2016-02-12 LAB — AEROBIC CULTURE W GRAM STAIN (SUPERFICIAL SPECIMEN)
Culture: NORMAL
Special Requests: NORMAL

## 2016-02-12 LAB — AEROBIC CULTURE  (SUPERFICIAL SPECIMEN): GRAM STAIN: NONE SEEN

## 2016-05-27 ENCOUNTER — Other Ambulatory Visit: Payer: Self-pay | Admitting: Neurology

## 2016-12-02 DIAGNOSIS — M2550 Pain in unspecified joint: Secondary | ICD-10-CM | POA: Insufficient documentation

## 2016-12-02 DIAGNOSIS — M47816 Spondylosis without myelopathy or radiculopathy, lumbar region: Secondary | ICD-10-CM | POA: Insufficient documentation

## 2016-12-02 DIAGNOSIS — M797 Fibromyalgia: Secondary | ICD-10-CM | POA: Insufficient documentation

## 2017-01-04 DIAGNOSIS — G894 Chronic pain syndrome: Secondary | ICD-10-CM | POA: Insufficient documentation

## 2017-01-04 DIAGNOSIS — M5136 Other intervertebral disc degeneration, lumbar region: Secondary | ICD-10-CM | POA: Insufficient documentation

## 2017-08-10 ENCOUNTER — Other Ambulatory Visit: Payer: Self-pay

## 2017-08-10 ENCOUNTER — Encounter (HOSPITAL_COMMUNITY): Payer: Self-pay | Admitting: Emergency Medicine

## 2017-08-10 ENCOUNTER — Emergency Department (HOSPITAL_COMMUNITY)
Admission: EM | Admit: 2017-08-10 | Discharge: 2017-08-11 | Disposition: A | Discharge status: Home / Self Care | Payer: Medicaid Other | Attending: Emergency Medicine | Admitting: Emergency Medicine

## 2017-08-10 DIAGNOSIS — R55 Syncope and collapse: Secondary | ICD-10-CM | POA: Diagnosis not present

## 2017-08-10 LAB — BASIC METABOLIC PANEL
Anion gap: 11 (ref 5–15)
BUN: 10 mg/dL (ref 6–20)
CO2: 25 mmol/L (ref 22–32)
CREATININE: 0.73 mg/dL (ref 0.44–1.00)
Calcium: 9.2 mg/dL (ref 8.9–10.3)
Chloride: 100 mmol/L — ABNORMAL LOW (ref 101–111)
Glucose, Bld: 71 mg/dL (ref 65–99)
POTASSIUM: 3.3 mmol/L — AB (ref 3.5–5.1)
SODIUM: 136 mmol/L (ref 135–145)

## 2017-08-10 LAB — CBC
HCT: 37.7 % (ref 36.0–46.0)
Hemoglobin: 12 g/dL (ref 12.0–15.0)
MCH: 28.6 pg (ref 26.0–34.0)
MCHC: 31.8 g/dL (ref 30.0–36.0)
MCV: 90 fL (ref 78.0–100.0)
PLATELETS: 274 10*3/uL (ref 150–400)
RBC: 4.19 MIL/uL (ref 3.87–5.11)
RDW: 13.2 % (ref 11.5–15.5)
WBC: 8.2 10*3/uL (ref 4.0–10.5)

## 2017-08-10 LAB — CBG MONITORING, ED: Glucose-Capillary: 94 mg/dL (ref 65–99)

## 2017-08-10 LAB — TSH: TSH: 3.474 u[IU]/mL (ref 0.350–4.500)

## 2017-08-10 MED ORDER — SODIUM CHLORIDE 0.9 % IV BOLUS
1000.0000 mL | Freq: Once | INTRAVENOUS | Status: AC
  Administered 2017-08-10: 1000 mL via INTRAVENOUS

## 2017-08-10 NOTE — ED Provider Notes (Signed)
[facility] EMERGENCY DEPARTMENT Provider Note   CSN: [id number] Arrival date & time: 08/10/17  2039     History   Chief Complaint Chief Complaint  Patient presents with  . Near Syncope    HPI Dawn Richard is a 47 y.o. female presenting for evaluation of multiple episodes of syncope and near syncope over the past week.  She has a past medical history significant for chronic fatigue, fibromyalgia, MS and seizure disorder for which she takes Keppra.  She has had no recent changes in her medications or medication doses and denies nausea, vomiting, abdominal pain, chest pain, shortness of breath, fevers or chills.  She has had episodes of lightheadedness followed by loss of vision, hearing and then collapse.  She denies chest pain or palpitations with these episodes.  They mostly occur when she is sitting and not with sudden positional changes.  She denies any risk factors for dehydration.  She does endorse generalized chronic fatigue which has been worse this past week.  She has had no recent headaches, confusion or focal weakness.  Her MS flares historically have been characterized by confusion. She denies recent seizures, reporting clonic movements in her extremities with this condition.  The history is provided by the patient.    Past Medical History:  Diagnosis Date  . Anxiety   . Arthritis   . Back pain   . Depression   . Fatigue   . Fibromyalgia   . Gastroenteritis   . Joint pain   . Low back pain   . Migraine   . MS (multiple sclerosis) (HCC)   . Neck pain   . Rib pain on left side   . Seizure (HCC)   . Seizure (HCC)   . Vitamin D deficiency     Patient Active Problem List   Diagnosis Date Noted  . MS (multiple sclerosis) (HCC)   . Fatigue   . Gastroenteritis   . Low back pain   . Seizure (HCC)   . Anxiety   . Back pain   . Migraine   . Joint pain   . Rib pain on left side   . Vitamin D deficiency   . Arthritis     Past Surgical History:  Procedure  Laterality Date  . BACK SURGERY    . KNEE SURGERY Left      OB History    Gravida  2   Para  2   Term  2   Preterm      AB      Living        SAB      TAB      Ectopic      Multiple      Live Births               Home Medications    Prior to Admission medications   Medication Sig Start Date End Date Taking? Authorizing Provider  DULoxetine (CYMBALTA) 30 MG capsule Take 30 mg by mouth 2 (two) times daily.   Yes [provider]  gabapentin (NEURONTIN) 600 MG tablet Take 600 mg by mouth 3 (three) times daily.  12/06/15  Yes [provider]  levETIRAcetam (KEPPRA) 750 MG tablet Take 750 mg by mouth 2 (two) times daily.   Yes [provider]  nabumetone (RELAFEN) 750 MG tablet Take 750 mg by mouth 2 (two) times daily.   Yes [provider]  omeprazole (PRILOSEC) 20 MG capsule Take 20 mg by  mouth daily.   Yes [provider]  oxyCODONE-acetaminophen (PERCOCET) 10-325 MG tablet Take 1 tablet by mouth every 6 (six) hours. 01/10/16  Yes [provider]  tiZANidine (ZANAFLEX) 4 MG capsule Take 4 mg by mouth at bedtime.    Yes [provider]    Family History Family History  Problem Relation Age of Onset  . High blood pressure Mother   . High Cholesterol Mother   . Heart disease Father   . High blood pressure Father     Social History Social History   Tobacco Use  . Smoking status: Never Smoker  . Smokeless tobacco: Never Used  Substance Use Topics  . Alcohol use: Yes    Alcohol/week: 0.6 oz    Types: 1 Cans of beer per week    Comment: OCC  . Drug use: No     Allergies   Bee venom and Doxycycline   Review of Systems Review of Systems  Constitutional: Negative for fever.  HENT: Negative for congestion and sore throat.   Eyes: Negative.        No conjunctival pallor  Respiratory: Negative for chest tightness and shortness of breath.   Cardiovascular: Negative for chest pain.    Gastrointestinal: Negative for abdominal pain, nausea and vomiting.  Genitourinary: Negative.   Musculoskeletal: Negative for arthralgias, joint swelling and neck pain.  Skin: Negative.  Negative for rash and wound.  Neurological: Positive for syncope and weakness. Negative for dizziness, light-headedness, numbness and headaches.  Psychiatric/Behavioral: Negative.      Physical Exam Updated Vital Signs BP 104/72 (BP Location: Left Arm)   Pulse 77   Temp 97.7 F (36.5 C) (Oral)   Resp 18   Ht 5\' 2"  (1.575 m)   Wt 77.1 kg (170 lb)   LMP 02/11/2012   SpO2 97%   BMI 31.09 kg/m   Physical Exam  Constitutional: She appears well-developed and well-nourished.  HENT:  Head: Normocephalic and atraumatic.  Eyes: Pupils are equal, round, and reactive to light. Conjunctivae and EOM are normal.  Neck: Normal range of motion.  Cardiovascular: Normal rate, regular rhythm, normal heart sounds and intact distal pulses.  Pulmonary/Chest: Effort normal and breath sounds normal. She has no wheezes.  Abdominal: Soft. Bowel sounds are normal. There is no tenderness. There is no guarding.  Musculoskeletal: Normal range of motion.  Neurological: She is alert.  Skin: Skin is warm and dry.  Psychiatric: She has a normal mood and affect.  Nursing note and vitals reviewed.    ED Treatments / Results  Labs (all labs ordered are listed, but only abnormal results are displayed) Labs Reviewed  BASIC METABOLIC PANEL - Abnormal; Notable for the following components:      Result Value   Potassium 3.3 (*)    Chloride 100 (*)    All other components within normal limits  CBC  TSH  URINALYSIS, ROUTINE W REFLEX MICROSCOPIC  CBG MONITORING, ED    EKG EKG Interpretation  Date/Time:  Tuesday August 10 2017 22:07:31 EDT Ventricular Rate:  73 PR Interval:  128 QRS Duration: 88 QT Interval:  432 QTC Calculation: 477 R Axis:   57 Text Interpretation:  Age not entered, assumed to be  47 years old  for purpose of ECG interpretation Sinus rhythm Borderline prolonged QT interval No STEMI.  Confirmed by Alona Bene [phone]) on 08/10/2017 11:27:53 PM   Radiology No results found.  Procedures Procedures (including critical care time)  Medications Ordered in ED Medications  sodium chloride 0.9 % bolus 1,000 mL (1,000 mLs Intravenous New Bag/Given 08/10/17 2223)     Initial Impression / Assessment and Plan / ED Course  I have reviewed the triage vital signs and the nursing notes.  Pertinent labs & imaging results that were available during my care of the patient were reviewed by me and considered in my medical decision making (see chart for details).    Patient with a history of seizure disorder, MS, generalized fatigue which is been worse this past week in association with multiple episodes of syncope.  Her review of the chart reveals she had a significantly elevated TSH level in 2015.  It appears that this was not followed up with any additional testing as she states she has not had any thyroid testing.  This could explain her chronic fatigue.  A repeat TSH was ordered today.  Labs, EKG, IV fluids and then orthostatic vital signs were ordered.  Pt had no sx while in dept.  She received IV fluids and had no sx with orthostatic VS or with ambulation in dept.  She felt improved. Labs stable, ekg nsr. No tachycardia, no c/o sob, cp, pleuritic sx.  No risk factors for PE, again pt denies palpitations or chest pain.  Pt was appropriately screened in the ed with no indication for admission but would benefit from close outpatient f/u with cardiology.  Referral given to Dr Wyline Mood for close office f/u.  In interim, advised recheck here for any new or worsened sx.  Caution advised about driving or any potentially dangerous activities while these sx present.  The patient appears reasonably screened and/or stabilized for discharge and I doubt any other medical condition or other [facility] requiring further  screening, evaluation, or treatment in the ED at this time prior to discharge.    Final Clinical Impressions(s) / ED Diagnoses   Final diagnoses:  Syncope and collapse    ED Discharge Orders    None       Victoriano Lain 08/11/17 0045    Maia Plan, MD 08/11/17 [phone]

## 2017-08-10 NOTE — ED Notes (Signed)
PA at bedside.

## 2017-08-10 NOTE — ED Triage Notes (Signed)
Pt reports syncopal episodes since Friday with low BP readings. Happened once today. States that her eyesight goes black when this happens, unknown of LOC. Denies new medication change.

## 2017-08-11 NOTE — Discharge Instructions (Addendum)
Return here or call your doctor for any persistent or worsening symptoms.  Call Dr. Wyline Mood for an office visit as soon as possible for further evaluation of your symptoms.

## 2017-08-13 ENCOUNTER — Observation Stay (HOSPITAL_COMMUNITY)
Admission: EM | Admit: 2017-08-13 | Discharge: 2017-08-14 | Disposition: A | Discharge status: Home / Self Care | Payer: Medicaid Other | Attending: Internal Medicine | Admitting: Internal Medicine

## 2017-08-13 ENCOUNTER — Other Ambulatory Visit: Payer: Self-pay

## 2017-08-13 ENCOUNTER — Emergency Department (HOSPITAL_COMMUNITY): Payer: Medicaid Other

## 2017-08-13 ENCOUNTER — Encounter (HOSPITAL_COMMUNITY): Payer: Self-pay

## 2017-08-13 DIAGNOSIS — R55 Syncope and collapse: Secondary | ICD-10-CM | POA: Diagnosis not present

## 2017-08-13 DIAGNOSIS — R569 Unspecified convulsions: Secondary | ICD-10-CM | POA: Diagnosis not present

## 2017-08-13 DIAGNOSIS — F419 Anxiety disorder, unspecified: Secondary | ICD-10-CM | POA: Insufficient documentation

## 2017-08-13 DIAGNOSIS — F329 Major depressive disorder, single episode, unspecified: Secondary | ICD-10-CM | POA: Insufficient documentation

## 2017-08-13 DIAGNOSIS — G8929 Other chronic pain: Secondary | ICD-10-CM | POA: Diagnosis not present

## 2017-08-13 DIAGNOSIS — D649 Anemia, unspecified: Secondary | ICD-10-CM | POA: Diagnosis present

## 2017-08-13 DIAGNOSIS — G35 Multiple sclerosis: Secondary | ICD-10-CM | POA: Diagnosis not present

## 2017-08-13 DIAGNOSIS — Z79899 Other long term (current) drug therapy: Secondary | ICD-10-CM | POA: Diagnosis not present

## 2017-08-13 DIAGNOSIS — R079 Chest pain, unspecified: Secondary | ICD-10-CM | POA: Diagnosis not present

## 2017-08-13 LAB — BASIC METABOLIC PANEL
Anion gap: 10 (ref 5–15)
BUN: 9 mg/dL (ref 6–20)
CO2: 25 mmol/L (ref 22–32)
CREATININE: 0.62 mg/dL (ref 0.44–1.00)
Calcium: 9 mg/dL (ref 8.9–10.3)
Chloride: 104 mmol/L (ref 101–111)
GFR calc Af Amer: >60 mL/min (ref >60)
GFR calc non Af Amer: >60 mL/min (ref >60)
GLUCOSE: 68 mg/dL (ref 65–99)
POTASSIUM: 3.6 mmol/L (ref 3.5–5.1)
Sodium: 139 mmol/L (ref 135–145)

## 2017-08-13 LAB — URINALYSIS, ROUTINE W REFLEX MICROSCOPIC
Bilirubin Urine: NEGATIVE
Glucose, UA: NEGATIVE mg/dL
Hgb urine dipstick: NEGATIVE
Ketones, ur: NEGATIVE mg/dL
Nitrite: NEGATIVE
PROTEIN: NEGATIVE mg/dL
Specific Gravity, Urine: 1.013 (ref 1.005–1.030)
pH: 6 (ref 5.0–8.0)

## 2017-08-13 LAB — CBC
HEMATOCRIT: 34.2 % — AB (ref 36.0–46.0)
Hemoglobin: 10.9 g/dL — ABNORMAL LOW (ref 12.0–15.0)
MCH: 28.5 pg (ref 26.0–34.0)
MCHC: 31.9 g/dL (ref 30.0–36.0)
MCV: 89.5 fL (ref 78.0–100.0)
Platelets: 271 10*3/uL (ref 150–400)
RBC: 3.82 MIL/uL — ABNORMAL LOW (ref 3.87–5.11)
RDW: 13.3 % (ref 11.5–15.5)
WBC: 6.7 10*3/uL (ref 4.0–10.5)

## 2017-08-13 LAB — CBG MONITORING, ED: GLUCOSE-CAPILLARY: 164 mg/dL — AB (ref 65–99)

## 2017-08-13 LAB — TROPONIN I

## 2017-08-13 LAB — D-DIMER, QUANTITATIVE: D-Dimer, Quant: 0.28 ug/mL-FEU (ref 0.00–0.50)

## 2017-08-13 LAB — HCG, QUANTITATIVE, PREGNANCY: hCG, Beta Chain, Quant, S: 2 m[IU]/mL (ref <5)

## 2017-08-13 LAB — POC OCCULT BLOOD, ED: Fecal Occult Bld: NEGATIVE

## 2017-08-13 MED ORDER — SODIUM CHLORIDE 0.9 % IV BOLUS
1000.0000 mL | Freq: Once | INTRAVENOUS | Status: AC
  Administered 2017-08-13: 1000 mL via INTRAVENOUS

## 2017-08-13 MED ORDER — DULOXETINE HCL 30 MG PO CPEP
30.0000 mg | ORAL_CAPSULE | Freq: Two times a day (BID) | ORAL | Status: DC
  Administered 2017-08-13 – 2017-08-14 (×2): 30 mg via ORAL
  Filled 2017-08-13 (×2): qty 1

## 2017-08-13 MED ORDER — LEVETIRACETAM 500 MG PO TABS
750.0000 mg | ORAL_TABLET | Freq: Two times a day (BID) | ORAL | Status: DC
  Administered 2017-08-13 – 2017-08-14 (×2): 750 mg via ORAL
  Filled 2017-08-13 (×2): qty 1

## 2017-08-13 MED ORDER — ACETAMINOPHEN 650 MG RE SUPP: 650.0000 mg | Freq: Four times a day (QID) | RECTAL | Status: DC | PRN

## 2017-08-13 MED ORDER — OXYCODONE-ACETAMINOPHEN 10-325 MG PO TABS: 1.0000 | ORAL_TABLET | Freq: Four times a day (QID) | ORAL | Status: DC | PRN

## 2017-08-13 MED ORDER — OXYCODONE HCL 5 MG PO TABS
5.0000 mg | ORAL_TABLET | Freq: Four times a day (QID) | ORAL | Status: DC | PRN
  Administered 2017-08-13: 5 mg via ORAL
  Filled 2017-08-13: qty 1

## 2017-08-13 MED ORDER — NABUMETONE 500 MG PO TABS
750.0000 mg | ORAL_TABLET | Freq: Two times a day (BID) | ORAL | Status: DC
  Administered 2017-08-13 – 2017-08-14 (×2): 750 mg via ORAL
  Filled 2017-08-13 (×6): qty 1

## 2017-08-13 MED ORDER — TIZANIDINE HCL 4 MG PO TABS
4.0000 mg | ORAL_TABLET | Freq: Every day | ORAL | Status: DC
  Administered 2017-08-13: 4 mg via ORAL
  Filled 2017-08-13: qty 1

## 2017-08-13 MED ORDER — ACETAMINOPHEN 325 MG PO TABS: 650.0000 mg | ORAL_TABLET | Freq: Four times a day (QID) | ORAL | Status: DC | PRN

## 2017-08-13 MED ORDER — SENNOSIDES-DOCUSATE SODIUM 8.6-50 MG PO TABS: 1.0000 | ORAL_TABLET | Freq: Every evening | ORAL | Status: DC | PRN

## 2017-08-13 MED ORDER — POTASSIUM CHLORIDE IN NACL 20-0.9 MEQ/L-% IV SOLN
INTRAVENOUS | Status: AC
  Administered 2017-08-13 – 2017-08-14 (×2): via INTRAVENOUS

## 2017-08-13 MED ORDER — NABUMETONE 500 MG PO TABS
ORAL_TABLET | ORAL | Status: AC
  Filled 2017-08-13: qty 2

## 2017-08-13 MED ORDER — SODIUM CHLORIDE 0.9% FLUSH
3.0000 mL | Freq: Two times a day (BID) | INTRAVENOUS | Status: DC
  Administered 2017-08-13: 3 mL via INTRAVENOUS

## 2017-08-13 MED ORDER — ENOXAPARIN SODIUM 40 MG/0.4ML ~~LOC~~ SOLN
40.0000 mg | SUBCUTANEOUS | Status: DC
  Administered 2017-08-13: 40 mg via SUBCUTANEOUS
  Filled 2017-08-13: qty 0.4

## 2017-08-13 MED ORDER — OXYCODONE-ACETAMINOPHEN 5-325 MG PO TABS
1.0000 | ORAL_TABLET | Freq: Four times a day (QID) | ORAL | Status: DC | PRN
  Administered 2017-08-13: 1 via ORAL
  Filled 2017-08-13: qty 1

## 2017-08-13 MED ORDER — ONDANSETRON HCL 4 MG PO TABS: 4.0000 mg | ORAL_TABLET | Freq: Four times a day (QID) | ORAL | Status: DC | PRN

## 2017-08-13 MED ORDER — ONDANSETRON HCL 4 MG/2ML IJ SOLN
4.0000 mg | Freq: Four times a day (QID) | INTRAMUSCULAR | Status: DC | PRN
Start: 2017-08-13 — End: 2017-08-14

## 2017-08-13 MED ORDER — PANTOPRAZOLE SODIUM 40 MG PO TBEC
40.0000 mg | DELAYED_RELEASE_TABLET | Freq: Every day | ORAL | Status: DC
  Administered 2017-08-14: 40 mg via ORAL
  Filled 2017-08-13: qty 1

## 2017-08-13 MED ORDER — BISACODYL 5 MG PO TBEC: 5.0000 mg | DELAYED_RELEASE_TABLET | Freq: Every day | ORAL | Status: DC | PRN

## 2017-08-13 MED ORDER — GABAPENTIN 300 MG PO CAPS
600.0000 mg | ORAL_CAPSULE | Freq: Three times a day (TID) | ORAL | Status: DC
  Administered 2017-08-13 – 2017-08-14 (×2): 600 mg via ORAL
  Filled 2017-08-13 (×2): qty 2

## 2017-08-13 NOTE — ED Provider Notes (Signed)
[facility] MEDICAL SURGICAL UNIT Provider Note   CSN: [id number] Arrival date & time: 08/13/17  1600     History   Chief Complaint Chief Complaint  Patient presents with  . Loss of Consciousness    HPI Eden F Mangels is a 47 y.o. female.  HPI  47 year old female with a history of multiple sclerosis, fibromyalgia, and chronic back pain presents with syncope.  This has been a recurring issue for the last 2 weeks.  She last had an episode on 4/16 when she was seen in the emergency department.  She has been set up for an outpatient appointment with Dr. Wyline Mood but this is not until [place].  She had another episode while at the store today.  She states as she was walking she acutely felt lightheaded, had chest tightness and shortness of breath.  She was able to lay herself down but then passed out.  She has a history of seizures as well but states this is completely different than when she has seizures.  She has not been having a headache today but occasionally she will have a headache after these episodes.  The chest tightness seems to typically accompany these episodes.  She can feel her heart beating in her chest but does not necessarily think it has been irregular.  She states she still feels some chest tightness.  She has had some lower extremity swelling that has been bilateral but she thinks it might be a little worse than left compared to right.  She has not had any recent change in her medications.  These episodes have occurred at different types of positions.  She did not just stand up today prior to this happening.  It has occurred at rest before as well.  Past Medical History:  Diagnosis Date  . Anxiety   . Arthritis   . Back pain   . Depression   . Fatigue   . Fibromyalgia   . Gastroenteritis   . Joint pain   . Low back pain   . Migraine   . MS (multiple sclerosis) (HCC)   . Neck pain   . Rib pain on left side   . Seizure (HCC)   . Seizure (HCC)   . Vitamin D deficiency      Patient Active Problem List   Diagnosis Date Noted  . Chronic pain 08/13/2017  . Syncope 08/13/2017  . Normocytic anemia 08/13/2017  . MS (multiple sclerosis) (HCC)   . Fatigue   . Gastroenteritis   . Low back pain   . Seizure (HCC)   . Anxiety   . Back pain   . Migraine   . Joint pain   . Rib pain on left side   . Vitamin D deficiency   . Arthritis     Past Surgical History:  Procedure Laterality Date  . BACK SURGERY    . KNEE SURGERY Left      OB History    Gravida  2   Para  2   Term  2   Preterm      AB      Living        SAB      TAB      Ectopic      Multiple      Live Births               Home Medications    Prior to Admission medications   Medication Sig Start Date End Date  Taking? Authorizing Provider  DULoxetine (CYMBALTA) 30 MG capsule Take 30 mg by mouth 2 (two) times daily.   Yes [provider]  gabapentin (NEURONTIN) 600 MG tablet Take 600 mg by mouth 3 (three) times daily.  12/06/15  Yes [provider]  levETIRAcetam (KEPPRA) 750 MG tablet Take 750 mg by mouth 2 (two) times daily.   Yes [provider]  nabumetone (RELAFEN) 750 MG tablet Take 750 mg by mouth 2 (two) times daily.   Yes [provider]  omeprazole (PRILOSEC) 20 MG capsule Take 20 mg by mouth daily.   Yes [provider]  oxyCODONE-acetaminophen (PERCOCET) 10-325 MG tablet Take 1 tablet by mouth every 6 (six) hours. 01/10/16  Yes [provider]  tiZANidine (ZANAFLEX) 4 MG capsule Take 4 mg by mouth at bedtime.    Yes [provider]    Family History Family History  Problem Relation Age of Onset  . High blood pressure Mother   . High Cholesterol Mother   . Heart disease Father   . High blood pressure Father     Social History Social History   Tobacco Use  . Smoking status: Never Smoker  . Smokeless tobacco: Never Used  Substance Use Topics  . Alcohol use: Yes    Alcohol/week: 0.6 oz     Types: 1 Cans of beer per week    Comment: OCC  . Drug use: No     Allergies   Bee venom and Doxycycline   Review of Systems Review of Systems  Constitutional: Negative for fever.  Respiratory: Positive for chest tightness and shortness of breath.   Cardiovascular: Positive for leg swelling.  Gastrointestinal: Negative for abdominal pain, diarrhea and vomiting.  Genitourinary: Negative for dysuria.  Musculoskeletal: Positive for back pain.  Neurological: Positive for syncope and weakness. Negative for headaches.  All other systems reviewed and are negative.    Physical Exam Updated Vital Signs BP 108/79 (BP Location: Left Arm)   Pulse 74   Temp 97.9 F (36.6 C) (Oral)   Resp 16   Ht 5\' 2"  (1.575 m)   Wt 77.7 kg (171 lb 4.8 oz)   LMP 02/11/2012   SpO2 100%   BMI 31.33 kg/m   Physical Exam  Constitutional: She is oriented to person, place, and time. She appears well-developed and well-nourished. No distress.  HENT:  Head: Normocephalic and atraumatic.  Right Ear: External ear normal.  Left Ear: External ear normal.  Nose: Nose normal.  Eyes: Pupils are equal, round, and reactive to light. EOM are normal. Right eye exhibits no discharge. Left eye exhibits no discharge.  Cardiovascular: Normal rate, regular rhythm and normal heart sounds.  No murmur heard. Pulmonary/Chest: Effort normal and breath sounds normal. She has no wheezes.  Abdominal: Soft. She exhibits no distension. There is no tenderness.  Genitourinary: Rectal exam shows no external hemorrhoid.  Genitourinary Comments: Brown stool on DRE  Musculoskeletal: She exhibits no edema.  No significant calf/leg swelling. No pitting edema. No unilateral swelling  Neurological: She is alert and oriented to person, place, and time.  CN 3-12 grossly intact. 5/5 strength in all 4 extremities. Grossly normal sensation. Normal finger to nose.   Skin: Skin is warm and dry. She is not diaphoretic.  Nursing note and  vitals reviewed.    ED Treatments / Results  Labs (all labs ordered are listed, but only abnormal results are displayed) Labs Reviewed  CBC - Abnormal; Notable for the following components:  Result Value   RBC 3.82 (*)    Hemoglobin 10.9 (*)    HCT 34.2 (*)    All other components within normal limits  URINALYSIS, ROUTINE W REFLEX MICROSCOPIC - Abnormal; Notable for the following components:   APPearance HAZY (*)    Leukocytes, UA SMALL (*)    Bacteria, UA RARE (*)    Squamous Epithelial / LPF 0-5 (*)    All other components within normal limits  CBG MONITORING, ED - Abnormal; Notable for the following components:   Glucose-Capillary 164 (*)    All other components within normal limits  BASIC METABOLIC PANEL  D-DIMER, QUANTITATIVE (NOT AT [facility])  TROPONIN I  HCG, QUANTITATIVE, PREGNANCY  HIV ANTIBODY (ROUTINE TESTING)  BASIC METABOLIC PANEL  CBC  POC URINE PREG, ED  POC OCCULT BLOOD, ED    EKG EKG Interpretation  Date/Time:  Friday August 13 2017 16:10:30 EDT Ventricular Rate:  63 PR Interval:  130 QRS Duration: 90 QT Interval:  454 QTC Calculation: 464 R Axis:   67 Text Interpretation:  Normal sinus rhythm Normal ECG no significant change since April 2019 Confirmed by Pricilla Loveless [phone]) on 08/13/2017 5:07:02 PM   Radiology Dg Chest 2 View  Result Date: 08/13/2017 CLINICAL DATA:  Shortness of breath EXAM: CHEST - 2 VIEW COMPARISON:  11/29/2014 FINDINGS: The heart size and mediastinal contours are within normal limits. Both lungs are clear. The visualized skeletal structures are unremarkable. IMPRESSION: No active cardiopulmonary disease. Electronically Signed   By: Alcide Clever M.D.   On: 08/13/2017 18:16    Procedures Procedures (including critical care time)    EMERGENCY DEPARTMENT [place] CARDIAC EXAM "Study: Limited Ultrasound of the Heart and Pericardium"  INDICATIONS:Abnormal vital signs, Chest pain and Dyspnea Multiple views of the heart and  pericardium were obtained in real-time with a multi-frequency probe.  PERFORMED NW:GNFAOZ IMAGES ARCHIVED?: Yes LIMITATIONS:  Body habitus VIEWS USED: Subcostal 4 chamber and Parasternal long axis INTERPRETATION: Cardiac activity present, Pericardial effusioin absent and Cardiac tamponade absent    Medications Ordered in ED Medications  DULoxetine (CYMBALTA) DR capsule 30 mg (30 mg Oral Given 08/13/17 2230)  gabapentin (NEURONTIN) capsule 600 mg (600 mg Oral Given 08/13/17 2227)  levETIRAcetam (KEPPRA) tablet 750 mg (750 mg Oral Given 08/13/17 2228)  nabumetone (RELAFEN) tablet 750 mg (750 mg Oral Given 08/13/17 2315)  pantoprazole (PROTONIX) EC tablet 40 mg (has no administration in time range)  tiZANidine (ZANAFLEX) tablet 4 mg (4 mg Oral Given 08/13/17 2231)  sodium chloride flush (NS) 0.9 % injection 3 mL (3 mLs Intravenous Given 08/13/17 2230)  enoxaparin (LOVENOX) injection 40 mg (40 mg Subcutaneous Given 08/13/17 2230)  acetaminophen (TYLENOL) tablet 650 mg (has no administration in time range)    Or  acetaminophen (TYLENOL) suppository 650 mg (has no administration in time range)  senna-docusate (Senokot-S) tablet 1 tablet (has no administration in time range)  bisacodyl (DULCOLAX) EC tablet 5 mg (has no administration in time range)  ondansetron (ZOFRAN) tablet 4 mg (has no administration in time range)    Or  ondansetron (ZOFRAN) injection 4 mg (has no administration in time range)  0.9 % NaCl with KCl 20 mEq/ L  infusion ( Intravenous New Bag/Given 08/13/17 2227)  oxyCODONE-acetaminophen (PERCOCET/ROXICET) 5-325 MG per tablet 1 tablet (1 tablet Oral Given 08/13/17 2315)    And  oxyCODONE (Oxy IR/ROXICODONE) immediate release tablet 5 mg (5 mg Oral Given 08/13/17 2315)  sodium chloride 0.9 % bolus 1,000 mL (0 mLs Intravenous Stopped  08/13/17 2015)  sodium chloride 0.9 % bolus 1,000 mL (1,000 mLs Intravenous New Bag/Given 08/13/17 2015)     Initial Impression / Assessment and Plan /  ED Course  I have reviewed the triage vital signs and the nursing notes.  Pertinent labs & imaging results that were available during my care of the patient were reviewed by me and considered in my medical decision making (see chart for details).     Unclear cause of syncope.  She is hypotensive although not distressed.  This improved with some fluids.  Given her symptoms of chest pain, ACS and PE workup obtained.  Given negative d-dimer and no hypoxia I think PE is less likely.  She is also had this on and off for 2 weeks.  ECG is unrevealing and initial troponin negative.  Given the symptoms I also did a bedside ultrasound which shows no obvious pericardial effusion or signs of tamponade.  Her blood pressure did improve with fluids.  This could all be autonomic dysfunction from her MS but given the degree of syncope she is been having with hypotension I think she will need to be observed.  Dr. Antionette Char to admit.  Final Clinical Impressions(s) / ED Diagnoses   Final diagnoses:  Syncope and collapse    ED Discharge Orders    None       Pricilla Loveless, MD 08/13/17 2359

## 2017-08-13 NOTE — ED Triage Notes (Signed)
Pt was seen 08/10/17 due to syncopal episodes. Pt states she had another syncopal episode today. Earliest appt that pt could be seen by Dr Wyline Mood is 5/20 Pt states she was at [employer] and started feeling strange and she set down at BP machine states she blacked out and when she came too she checked her BP it was 77/55

## 2017-08-13 NOTE — ED Notes (Signed)
Call for report  RN will call back in 10 minutes

## 2017-08-13 NOTE — H&P (Signed)
History and Physical    Dawn Richard UJW:[id number] DOB: 04-11-1971 DOA: 08/13/2017  PCP: Kirstie Peri, MD   Patient coming from: Home  Chief Complaint: Syncope   HPI: Dawn Richard is a 47 y.o. female with medical history significant for multiple sclerosis, chronic pain, and seizure disorder, now presenting to the emergency department after multiple syncopal episodes.  Patient reports that she been in her usual state of health until she suffered a syncopal episode approximately 2 weeks ago.  She has gone on to have several more of these episodes, was evaluated in the emergency department 3 days ago, was not orthostatic at that time, basic ED workup was reassuring, and she was discharged home.  Since that time, she has had additional episodes, including today.  She was at a drugstore, felt lightheaded, checked her blood pressure, reports SBP in the 70s, and suffered a syncopal event.  She quickly regained consciousness, denies headache, change in vision or hearing, or focal numbness or weakness, and denies chest pain or palpitations at this time.  ED Course: Upon arrival to the ED, patient is found to be afebrile, saturating well on room air, blood pressure 81/57, and vitals otherwise normal.  EKG features a normal sinus rhythm, chest x-rays negative for acute cardiopulmonary disease, chemistry panel is unremarkable, and CBC features a hemoglobin of 10.9, down from 12.0 three days earlier.  Fecal occult blood testing is negative, d-dimer is negative, troponin is undetectable, and urinalysis unremarkable.  Patient was given 2 L normal saline in the ED, blood pressure improved, she remains stable, and will be observed on the telemetry unit for ongoing evaluation and management of recurrent syncope.  Review of Systems:  All other systems reviewed and apart from HPI, are negative.  Past Medical History:  Diagnosis Date  . Anxiety   . Arthritis   . Back pain   . Depression   . Fatigue   .  Fibromyalgia   . Gastroenteritis   . Joint pain   . Low back pain   . Migraine   . MS (multiple sclerosis) (HCC)   . Neck pain   . Rib pain on left side   . Seizure (HCC)   . Seizure (HCC)   . Vitamin D deficiency     Past Surgical History:  Procedure Laterality Date  . BACK SURGERY    . KNEE SURGERY Left      reports that she has never smoked. She has never used smokeless tobacco. She reports that she drinks about 0.6 oz of alcohol per week. She reports that she does not use drugs.  Allergies  Allergen Reactions  . Bee Venom     swelling  . Doxycycline     "sick"    Family History  Problem Relation Age of Onset  . High blood pressure Mother   . High Cholesterol Mother   . Heart disease Father   . High blood pressure Father      Prior to Admission medications   Medication Sig Start Date End Date Taking? Authorizing Provider  DULoxetine (CYMBALTA) 30 MG capsule Take 30 mg by mouth 2 (two) times daily.   Yes [provider]  gabapentin (NEURONTIN) 600 MG tablet Take 600 mg by mouth 3 (three) times daily.  12/06/15  Yes [provider]  levETIRAcetam (KEPPRA) 750 MG tablet Take 750 mg by mouth 2 (two) times daily.   Yes [provider]  nabumetone (RELAFEN) 750 MG tablet Take 750 mg by mouth  2 (two) times daily.   Yes [provider]  omeprazole (PRILOSEC) 20 MG capsule Take 20 mg by mouth daily.   Yes [provider]  oxyCODONE-acetaminophen (PERCOCET) 10-325 MG tablet Take 1 tablet by mouth every 6 (six) hours. 01/10/16  Yes [provider]  tiZANidine (ZANAFLEX) 4 MG capsule Take 4 mg by mouth at bedtime.    Yes [provider]    Physical Exam: Vitals:   08/13/17 1830 08/13/17 1900 08/13/17 1930 08/13/17 2000  BP: (!) 89/65 92/65 91/64  102/69  Pulse: (!) 57 (!) 53 (!) 52 62  Resp: 14 13 14 13   Temp:      TempSrc:      SpO2: 100% 99% 98% 97%      Constitutional: NAD, calm  Eyes: PERTLA, lids  and conjunctivae normal ENMT: Mucous membranes are moist. Posterior pharynx clear of any exudate or lesions.   Neck: normal, supple, no masses, no thyromegaly Respiratory: clear to auscultation bilaterally, no wheezing, no crackles. Normal respiratory effort.    Cardiovascular: S1 & S2 heard, regular rate and rhythm. No extremity edema. No significant JVD. Abdomen: No distension, no tenderness, soft. Bowel sounds normal.  Musculoskeletal: no clubbing / cyanosis. No joint deformity upper and lower extremities.   Skin: no significant rashes, lesions, ulcers. Warm, dry, well-perfused. Neurologic: No facial asymmetry. Sensation intact. Moving all extremities.  Psychiatric: Alert and oriented x 3. Calm, cooperative.     Labs on Admission: I have personally reviewed following labs and imaging studies  CBC: Recent Labs  Lab 08/10/17 2100 08/13/17 1720  WBC 8.2 6.7  HGB 12.0 10.9*  HCT 37.7 34.2*  MCV 90.0 89.5  PLT 274 271   Basic Metabolic Panel: Recent Labs  Lab 08/10/17 2100 08/13/17 1720  NA 136 139  K 3.3* 3.6  CL 100* 104  CO2 25 25  GLUCOSE 71 68  BUN 10 9  CREATININE 0.73 0.62  CALCIUM 9.2 9.0   GFR: Estimated Creatinine Clearance: 86.3 mL/min (by C-G formula based on SCr of 0.62 mg/dL). Liver Function Tests: No results for input(s): AST, ALT, ALKPHOS, BILITOT, PROT, ALBUMIN in the last 168 hours. No results for input(s): LIPASE, AMYLASE in the last 168 hours. No results for input(s): AMMONIA in the last 168 hours. Coagulation Profile: No results for input(s): INR, PROTIME in the last 168 hours. Cardiac Enzymes: Recent Labs  Lab 08/13/17 1724  TROPONINI <0.03   BNP (last 3 results) No results for input(s): PROBNP in the last 8760 hours. HbA1C: No results for input(s): HGBA1C in the last 72 hours. CBG: Recent Labs  Lab 08/10/17 2053 08/13/17 1614  GLUCAP 94 164*   Lipid Profile: No results for input(s): CHOL, HDL, LDLCALC, TRIG, CHOLHDL, LDLDIRECT in  the last 72 hours. Thyroid Function Tests: Recent Labs    08/10/17 2158  TSH 3.474   Anemia Panel: No results for input(s): VITAMINB12, FOLATE, FERRITIN, TIBC, IRON, RETICCTPCT in the last 72 hours. Urine analysis:    Component Value Date/Time   COLORURINE YELLOW 08/13/2017 1904   APPEARANCEUR HAZY (A) 08/13/2017 1904   LABSPEC 1.013 08/13/2017 1904   PHURINE 6.0 08/13/2017 1904   GLUCOSEU NEGATIVE 08/13/2017 1904   HGBUR NEGATIVE 08/13/2017 1904   BILIRUBINUR NEGATIVE 08/13/2017 1904   KETONESUR NEGATIVE 08/13/2017 1904   PROTEINUR NEGATIVE 08/13/2017 1904   UROBILINOGEN 0.2 07/09/2008 1128   NITRITE NEGATIVE 08/13/2017 1904   LEUKOCYTESUR SMALL (A) 08/13/2017 1904   Sepsis Labs: @LABRCNTIP (procalcitonin:4,lacticidven:4) )No results found for this or  any previous visit (from the past 240 hour(s)).   Radiological Exams on Admission: Dg Chest 2 View  Result Date: 08/13/2017 CLINICAL DATA:  Shortness of breath EXAM: CHEST - 2 VIEW COMPARISON:  11/29/2014 FINDINGS: The heart size and mediastinal contours are within normal limits. Both lungs are clear. The visualized skeletal structures are unremarkable. IMPRESSION: No active cardiopulmonary disease. Electronically Signed   By: Alcide Clever M.D.   On: 08/13/2017 18:16    EKG: Independently reviewed. Normal sinus rhythm.   Assessment/Plan  1. Syncope  - Presents after multiple syncopal episodes over the past 2 weeks  - This has been associated with low BP's, but not orthostatic during recent ED visit  - Has occurred on standing, but also while seated, preceded by lightheadedness, but not chest pain or palpitations  - Treated with 2 liters NS in ED  - Check orthostatic vitals, continue cardiac monitoring, check echocardiogram    2. Multiple sclerosis  - Has been quiescent    3. Chronic pain  - No pain complaints on admission   - Continue home regimen with Cymbalta, Relafen, Neurontin, Zanaflex, and Percocet    4.  Normocytic anemia  - Hgb is 10.9 on admission, down from 12.0 three days earlier  - Denies melena or hematochezia and FOBT is negative   - Possibly dilutional after fluid-resuscitation in ED on 4/16   5. Seizure disorder  - No seizure-like activity  - Continue Keppra     DVT prophylaxis: Lovenox Code Status: Full  Family Communication: Discussed with patient Consults called: None Admission status: Observation   Briscoe Deutscher, MD [facility] Pager [phone]  If 7PM-7AM, please contact night-coverage [email or web address] Password [email or web address]  08/13/2017, 9:08 PM

## 2017-08-13 NOTE — ED Notes (Signed)
Pt out oof bed to BR

## 2017-08-13 NOTE — ED Notes (Signed)
From Rad 

## 2017-08-14 ENCOUNTER — Observation Stay (HOSPITAL_BASED_OUTPATIENT_CLINIC_OR_DEPARTMENT_OTHER): Payer: Medicaid Other

## 2017-08-14 DIAGNOSIS — I361 Nonrheumatic tricuspid (valve) insufficiency: Secondary | ICD-10-CM | POA: Diagnosis not present

## 2017-08-14 DIAGNOSIS — R55 Syncope and collapse: Secondary | ICD-10-CM | POA: Diagnosis not present

## 2017-08-14 DIAGNOSIS — G35 Multiple sclerosis: Secondary | ICD-10-CM | POA: Diagnosis not present

## 2017-08-14 LAB — CBC
HEMATOCRIT: 31.6 % — AB (ref 36.0–46.0)
HEMOGLOBIN: 10.1 g/dL — AB (ref 12.0–15.0)
MCH: 29.1 pg (ref 26.0–34.0)
MCHC: 32 g/dL (ref 30.0–36.0)
MCV: 91.1 fL (ref 78.0–100.0)
Platelets: 207 10*3/uL (ref 150–400)
RBC: 3.47 MIL/uL — ABNORMAL LOW (ref 3.87–5.11)
RDW: 13.7 % (ref 11.5–15.5)
WBC: 5.9 10*3/uL (ref 4.0–10.5)

## 2017-08-14 LAB — BASIC METABOLIC PANEL
ANION GAP: 8 (ref 5–15)
BUN: 7 mg/dL (ref 6–20)
CALCIUM: 8.3 mg/dL — AB (ref 8.9–10.3)
CO2: 23 mmol/L (ref 22–32)
Chloride: 110 mmol/L (ref 101–111)
Creatinine, Ser: 0.66 mg/dL (ref 0.44–1.00)
Glucose, Bld: 100 mg/dL — ABNORMAL HIGH (ref 65–99)
POTASSIUM: 4 mmol/L (ref 3.5–5.1)
Sodium: 141 mmol/L (ref 135–145)

## 2017-08-14 LAB — GLUCOSE, CAPILLARY: GLUCOSE-CAPILLARY: 90 mg/dL (ref 65–99)

## 2017-08-14 LAB — ECHOCARDIOGRAM COMPLETE
Height: 62 in
Weight: 2730.18 oz

## 2017-08-14 MED ORDER — SODIUM CHLORIDE 0.9 % IV BOLUS
500.0000 mL | Freq: Once | INTRAVENOUS | Status: AC
  Administered 2017-08-14: 500 mL via INTRAVENOUS

## 2017-08-14 MED ORDER — SODIUM CHLORIDE 0.9 % IV BOLUS
500.0000 mL | Freq: Once | INTRAVENOUS | Status: AC | PRN
  Administered 2017-08-14: 500 mL via INTRAVENOUS

## 2017-08-14 NOTE — Progress Notes (Signed)
  Echocardiogram 2D Echocardiogram has been performed.  Janalyn Harder 08/14/2017, 9:13 AM

## 2017-08-14 NOTE — Discharge Summary (Signed)
Physician Discharge Summary  KAMY POINSETT ZOX:[id number] DOB: 1970-10-20 DOA: 08/13/2017  PCP: Kirstie Peri, MD  Admit date: 08/13/2017 Discharge date: 08/14/2017  Time spent: 45 minutes  Recommendations for Outpatient Follow-up:  -To be discharged home today. -Advise close follow-up with PCP and with neurology in 2 weeks.  Discharge Diagnoses:  Principal Problem:   Syncope Active Problems:   Seizure (HCC)   MS (multiple sclerosis) (HCC)   Chronic pain   Normocytic anemia   Discharge Condition: Stable and improved  Filed Weights   08/13/17 2224 08/14/17 0601  Weight: 77.7 kg (171 lb 4.8 oz) 77.4 kg (170 lb 10.2 oz)    History of present illness:  As per Dr. Antionette Char on 4/19: Dawn Richard is a 47 y.o. female with medical history significant for multiple sclerosis, chronic pain, and seizure disorder, now presenting to the emergency department after multiple syncopal episodes.  Patient reports that she been in her usual state of health until she suffered a syncopal episode approximately 2 weeks ago.  She has gone on to have several more of these episodes, was evaluated in the emergency department 3 days ago, was not orthostatic at that time, basic ED workup was reassuring, and she was discharged home.  Since that time, she has had additional episodes, including today.  She was at a drugstore, felt lightheaded, checked her blood pressure, reports SBP in the 70s, and suffered a syncopal event.  She quickly regained consciousness, denies headache, change in vision or hearing, or focal numbness or weakness, and denies chest pain or palpitations at this time.  ED Course: Upon arrival to the ED, patient is found to be afebrile, saturating well on room air, blood pressure 81/57, and vitals otherwise normal.  EKG features a normal sinus rhythm, chest x-rays negative for acute cardiopulmonary disease, chemistry panel is unremarkable, and CBC features a hemoglobin of 10.9, down from 12.0 three  days earlier.  Fecal occult blood testing is negative, d-dimer is negative, troponin is undetectable, and urinalysis unremarkable.  Patient was given 2 L normal saline in the ED, blood pressure improved, she remains stable, and will be observed on the telemetry unit for ongoing evaluation and management of recurrent syncope.    Hospital Course:   Syncopal event -Patient has had multiple syncopal events over the past 2 weeks -This is not surprising with an initial blood pressure of 60-70 systolic. -She has received IV fluid hydration, blood pressures up to 110-130 systolic. -She has ambulated with stable blood pressures and no dizziness. -She does admit to decreased oral intake, unclear if autonomic dysfunction with multiple sclerosis might be playing a role.  Advise close follow-up with neurology for this issue.  Rest of chronic conditions have been stable during this short hospitalization  Procedures:  None  Consultations:  None  Discharge Instructions  Discharge Instructions    Diet - low sodium heart healthy   Complete by:  As directed    Increase activity slowly   Complete by:  As directed      Allergies as of 08/14/2017      Reactions   Bee Venom    swelling   Doxycycline    "sick"      Medication List    TAKE these medications   DULoxetine 30 MG capsule Commonly known as:  CYMBALTA Take 30 mg by mouth 2 (two) times daily.   gabapentin 600 MG tablet Commonly known as:  NEURONTIN Take 600 mg by mouth 3 (three) times  daily.   levETIRAcetam 750 MG tablet Commonly known as:  KEPPRA Take 750 mg by mouth 2 (two) times daily.   nabumetone 750 MG tablet Commonly known as:  RELAFEN Take 750 mg by mouth 2 (two) times daily.   omeprazole 20 MG capsule Commonly known as:  PRILOSEC Take 20 mg by mouth daily.   oxyCODONE-acetaminophen 10-325 MG tablet Commonly known as:  PERCOCET Take 1 tablet by mouth every 6 (six) hours.   tiZANidine 4 MG capsule Commonly  known as:  ZANAFLEX Take 4 mg by mouth at bedtime.      Allergies  Allergen Reactions  . Bee Venom     swelling  . Doxycycline     "sick"   Follow-up Information    Kirstie Peri, MD. Schedule an appointment as soon as possible for a visit in 2 week(s).   Specialty:  Internal Medicine Contact information: [address]  [place] [address] [phone]            The results of significant diagnostics from this hospitalization (including imaging, microbiology, ancillary and laboratory) are listed below for reference.    Significant Diagnostic Studies: Dg Chest 2 View  Result Date: 08/13/2017 CLINICAL DATA:  Shortness of breath EXAM: CHEST - 2 VIEW COMPARISON:  11/29/2014 FINDINGS: The heart size and mediastinal contours are within normal limits. Both lungs are clear. The visualized skeletal structures are unremarkable. IMPRESSION: No active cardiopulmonary disease. Electronically Signed   By: Alcide Clever M.D.   On: 08/13/2017 18:16    Microbiology: No results found for this or any previous visit (from the past 240 hour(s)).   Labs: Basic Metabolic Panel: Recent Labs  Lab 08/10/17 2100 08/13/17 1720 08/14/17 0656  NA 136 139 141  K 3.3* 3.6 4.0  CL 100* 104 110  CO2 25 25 23   GLUCOSE 71 68 100*  BUN 10 9 7   CREATININE 0.73 0.62 0.66  CALCIUM 9.2 9.0 8.3*   Liver Function Tests: No results for input(s): AST, ALT, ALKPHOS, BILITOT, PROT, ALBUMIN in the last 168 hours. No results for input(s): LIPASE, AMYLASE in the last 168 hours. No results for input(s): AMMONIA in the last 168 hours. CBC: Recent Labs  Lab 08/10/17 2100 08/13/17 1720 08/14/17 0656  WBC 8.2 6.7 5.9  HGB 12.0 10.9* 10.1*  HCT 37.7 34.2* 31.6*  MCV 90.0 89.5 91.1  PLT 274 271 207   Cardiac Enzymes: Recent Labs  Lab 08/13/17 1724  TROPONINI <0.03   BNP: BNP (last 3 results) No results for input(s): BNP in the last 8760 hours.  ProBNP (last 3 results) No results for input(s):  PROBNP in the last 8760 hours.  CBG: Recent Labs  Lab 08/10/17 2053 08/13/17 1614 08/14/17 0736  GLUCAP 94 164* 90       Signed:  Chaya Jan  [facility] Pager: [phone] 08/14/2017, 5:20 PM

## 2017-08-14 NOTE — Progress Notes (Signed)
IV removed, bleeding, pressure applied and dressing intact. D/C instructions given to pt. Verbalized understanding. Pt family at bedside to transport home.

## 2017-08-14 NOTE — Progress Notes (Signed)
Pt's BP taken at 0333 79/54, manual reading 80/60-MD made aware and 500 bolus was ordered. BP checked again at 0601 84/48, manual 82/70-MD made aware and another 500 cc bolus was ordered. Bolus hung, pt asymptomatic. Will continue to monitor patient.

## 2017-08-15 LAB — HIV ANTIBODY (ROUTINE TESTING W REFLEX): HIV SCREEN 4TH GENERATION: NONREACTIVE

## 2017-09-12 ENCOUNTER — Encounter: Payer: Self-pay | Admitting: Cardiology

## 2017-09-12 NOTE — Progress Notes (Deleted)
Cardiology Office Note  Date: 09/12/2017   ID: Mischelle Reeg Hardenbrook, DOB 02/12/1971, MRN [id number]  PCP: Kirstie Peri, MD  Consulting Cardiologist: Nona Dell, MD   No chief complaint on file.   History of Present Illness: Dawn Richard is a 47 y.o. female referred for cardiology consultation by Dr. Jacqulyn Bath Pattricia Boss [facility] ER evaluation April 16) for the assessment of syncope.  Records indicate hospital stay in late April on hospitalist service for evaluation of syncope. She was noted to be hypotensive with systolics in the 80s initially (possibly related to decreased PO intake +/- autonomic dysfunction), improved with IV fluids. No other acute issues were identified based on screening lab work.  Past Medical History:  Diagnosis Date  . Anxiety   . Arthritis   . Chronic pain   . Depression   . Fibromyalgia   . Gastroenteritis   . Low back pain   . Migraine   . MS (multiple sclerosis) (HCC)   . Seizure (HCC)   . Vitamin D deficiency     Past Surgical History:  Procedure Laterality Date  . BACK SURGERY    . KNEE SURGERY Left     Current Outpatient Medications  Medication Sig Dispense Refill  . DULoxetine (CYMBALTA) 30 MG capsule Take 30 mg by mouth 2 (two) times daily.    Marland Kitchen gabapentin (NEURONTIN) 600 MG tablet Take 600 mg by mouth 3 (three) times daily.   0  . levETIRAcetam (KEPPRA) 750 MG tablet Take 750 mg by mouth 2 (two) times daily.    . nabumetone (RELAFEN) 750 MG tablet Take 750 mg by mouth 2 (two) times daily.    Marland Kitchen omeprazole (PRILOSEC) 20 MG capsule Take 20 mg by mouth daily.    Marland Kitchen oxyCODONE-acetaminophen (PERCOCET) 10-325 MG tablet Take 1 tablet by mouth every 6 (six) hours.  0  . tiZANidine (ZANAFLEX) 4 MG capsule Take 4 mg by mouth at bedtime.      No current facility-administered medications for this visit.    Allergies:  Bee venom and Doxycycline   Social History: The patient  reports that she has never smoked. She has never used smokeless tobacco. She reports  that she drinks about 0.6 oz of alcohol per week. She reports that she does not use drugs.   Family History: The patient's family history includes Heart disease in her father; High Cholesterol in her mother; High blood pressure in her father and mother.   ROS:  Please see the history of present illness. Otherwise, complete review of systems is positive for {NONE DEFAULTED:18576::"none"}.  All other systems are reviewed and negative.   Physical Exam: VS:  LMP 02/11/2012 , BMI There is no height or weight on file to calculate BMI.  Wt Readings from Last 3 Encounters:  08/14/17 170 lb 10.2 oz (77.4 kg)  08/10/17 170 lb (77.1 kg)  02/11/16 164 lb (74.4 kg)    General: Patient appears comfortable at rest. HEENT: Conjunctiva and lids normal, oropharynx clear with moist mucosa. Neck: Supple, no elevated JVP or carotid bruits, no thyromegaly. Lungs: Clear to auscultation, nonlabored breathing at rest. Cardiac: Regular rate and rhythm, no S3 or significant systolic murmur, no pericardial rub. Abdomen: Soft, nontender, no hepatomegaly, bowel sounds present, no guarding or rebound. Extremities: No pitting edema, distal pulses 2+. Skin: Warm and dry. Musculoskeletal: No kyphosis. Neuropsychiatric: Alert and oriented x3, affect grossly appropriate.  ECG: I personally reviewed the tracing from 08/13/2017 which showed sinus rhythm.  Recent Labwork: 08/10/2017: TSH  3.474 08/14/2017: BUN 7; Creatinine, Ser 0.66; Hemoglobin 10.1; Platelets 207; Potassium 4.0; Sodium 141   Other Studies Reviewed Today:  Echocardiogram 08/14/2017: Study Conclusions  - Left ventricle: The cavity size was normal. Wall thickness was   normal. Systolic function was normal. The estimated ejection   fraction was in the range of 60% to 65%. Wall motion was normal;   there were no regional wall motion abnormalities. Left   ventricular diastolic function parameters were normal. - Right atrium: Central venous pressure (est):  8 mm Hg. - Atrial septum: No defect or patent foramen ovale was identified. - Tricuspid valve: There was mild regurgitation. - Pulmonary arteries: PA peak pressure: 37 mm Hg (S). - Pericardium, extracardiac: There was no pericardial effusion.  CXR 08/13/2017: FINDINGS: The heart size and mediastinal contours are within normal limits. Both lungs are clear. The visualized skeletal structures are unremarkable.  IMPRESSION: No active cardiopulmonary disease.  Assessment and Plan:    Current medicines were reviewed with the patient today.  No orders of the defined types were placed in this encounter.   Disposition:  Signed, Jonelle Sidle, MD, [facility] 09/12/2017 2:49 PM    [facility] [address] [place], [place], [place] [address] Phone: [phone]; Fax: [phone]

## 2017-09-13 ENCOUNTER — Ambulatory Visit: Payer: Medicaid Other | Admitting: Cardiology

## 2017-09-14 ENCOUNTER — Encounter: Payer: Self-pay | Admitting: Cardiology

## 2018-05-29 ENCOUNTER — Encounter (HOSPITAL_COMMUNITY): Payer: Self-pay | Admitting: *Deleted

## 2018-05-29 ENCOUNTER — Emergency Department (HOSPITAL_COMMUNITY): Payer: Medicaid Other

## 2018-05-29 DIAGNOSIS — Y929 Unspecified place or not applicable: Secondary | ICD-10-CM | POA: Diagnosis not present

## 2018-05-29 DIAGNOSIS — Y9389 Activity, other specified: Secondary | ICD-10-CM | POA: Insufficient documentation

## 2018-05-29 DIAGNOSIS — Z79899 Other long term (current) drug therapy: Secondary | ICD-10-CM | POA: Insufficient documentation

## 2018-05-29 DIAGNOSIS — X58XXXA Exposure to other specified factors, initial encounter: Secondary | ICD-10-CM | POA: Insufficient documentation

## 2018-05-29 DIAGNOSIS — Y999 Unspecified external cause status: Secondary | ICD-10-CM | POA: Insufficient documentation

## 2018-05-29 DIAGNOSIS — S6991XA Unspecified injury of right wrist, hand and finger(s), initial encounter: Secondary | ICD-10-CM | POA: Diagnosis present

## 2018-05-29 DIAGNOSIS — S62625A Displaced fracture of medial phalanx of left ring finger, initial encounter for closed fracture: Secondary | ICD-10-CM | POA: Diagnosis not present
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
## 2018-05-30 ENCOUNTER — Emergency Department (HOSPITAL_COMMUNITY)
Admission: EM | Admit: 2018-05-30 | Discharge: 2018-05-30 | Disposition: A | Discharge status: Home / Self Care | Payer: Medicaid Other | Attending: Emergency Medicine | Admitting: Emergency Medicine

## 2018-05-30 DIAGNOSIS — S62625A Displaced fracture of medial phalanx of left ring finger, initial encounter for closed fracture: Secondary | ICD-10-CM

## 2018-05-30 MED ORDER — ONDANSETRON HCL 4 MG PO TABS
4.0000 mg | ORAL_TABLET | Freq: Once | ORAL | Status: AC
  Administered 2018-05-30: 4 mg via ORAL
  Filled 2018-05-30: qty 1

## 2018-05-30 MED ORDER — HYDROCODONE-ACETAMINOPHEN 5-325 MG PO TABS
2.0000 | ORAL_TABLET | Freq: Once | ORAL | Status: AC
  Administered 2018-05-30: 2 via ORAL
  Filled 2018-05-30: qty 2

## 2018-05-30 MED ORDER — IBUPROFEN 800 MG PO TABS
800.0000 mg | ORAL_TABLET | Freq: Once | ORAL | Status: AC
Start: 2018-05-30 — End: 2018-05-30
  Administered 2018-05-30: 800 mg via ORAL
  Filled 2018-05-30: qty 1

## 2018-05-30 MED ORDER — IBUPROFEN 600 MG PO TABS: 600.0000 mg | ORAL_TABLET | Freq: Four times a day (QID) | ORAL | 0 refills | Status: DC | PRN

## 2018-05-30 MED ORDER — HYDROCODONE-ACETAMINOPHEN 5-325 MG PO TABS: 1.0000 | ORAL_TABLET | ORAL | 0 refills | Status: DC | PRN

## 2018-05-30 NOTE — Discharge Instructions (Addendum)
Your blood pressure is elevated. Please have this rechecked soon. You have a fracture that involves the joint of your middle finger, and is comminuted (more than one fragment).  It is important that you are evaluated by Dr. Romeo Apple, or the orthopedic specialist of your choice.  Please keep your hand elevated above your heart when possible.  Apply ice Monday and Tuesday.  Use ibuprofen with breakfast, lunch, dinner, and at bedtime.  Use Norco for more severe pain. This medication Janota cause drowsiness. Please do not drink, drive, or participate in activity that requires concentration while taking this medication.  Please discuss this fracture with Dr.Shah to assist with pain control if needed before your appointment with Dr. Romeo Apple.
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
## 2018-05-31 ENCOUNTER — Encounter: Payer: Self-pay | Admitting: Orthopaedic Surgery

## 2018-05-31 ENCOUNTER — Ambulatory Visit: Payer: Medicaid Other | Admitting: Orthopaedic Surgery

## 2018-05-31 ENCOUNTER — Ambulatory Visit (INDEPENDENT_AMBULATORY_CARE_PROVIDER_SITE_OTHER): Payer: Medicaid Other

## 2018-05-31 VITALS — BP 108/73 | HR 78 | Ht 62.0 in | Wt 173.0 lb

## 2018-05-31 DIAGNOSIS — G894 Chronic pain syndrome: Secondary | ICD-10-CM | POA: Diagnosis not present

## 2018-05-31 DIAGNOSIS — S62625A Displaced fracture of medial phalanx of left ring finger, initial encounter for closed fracture: Secondary | ICD-10-CM | POA: Diagnosis not present

## 2018-05-31 DIAGNOSIS — G35 Multiple sclerosis: Secondary | ICD-10-CM

## 2018-05-31 NOTE — Progress Notes (Signed)
Subjective:    Patient ID: Dawn Richard, female    DOB: [birth year], 48 y.o.   MRN: [id number]  HPI She fell and hurt her left nondominant ring finger on Saturday 05-28-2018.  She went to the ER on 05-29-2018 and had X-rays done which showed: Impression:  Intra-articular fracture of the distal portion of the middle phalanx at DIP joint.  She was given a splint and pain medicine. She had no other injuries.  She is taking oxycodone chronically.  She says the finger hurts and she is nauseous.   Review of Systems  Constitutional: Positive for activity change.  Musculoskeletal: Positive for arthralgias and back pain.  Neurological: Positive for seizures and headaches.  Psychiatric/Behavioral: The patient is nervous/anxious.   All other systems reviewed and are negative.  For Review of Systems, all other systems reviewed and are negative.  The following is a summary of the past history medically, past history surgically, known current medicines, social history and family history.  This information is gathered electronically by the computer from prior information and documentation.  I review this each visit and have found including this information at this point in the chart is beneficial and informative.   Past Medical History:  Diagnosis Date  . Anxiety   . Arthritis   . Chronic pain   . Depression   . Fibromyalgia   . Gastroenteritis   . Low back pain   . Migraine   . MS (multiple sclerosis) (HCC)   . Seizure (HCC)   . Vitamin D deficiency     Past Surgical History:  Procedure Laterality Date  . BACK SURGERY    . KNEE SURGERY Left     Current Outpatient Medications on File Prior to Visit  Medication Sig Dispense Refill  . DULoxetine (CYMBALTA) 30 MG capsule Take 30 mg by mouth 2 (two) times daily.    Marland Kitchen gabapentin (NEURONTIN) 600 MG tablet Take 600 mg by mouth 3 (three) times daily.   0  . ibuprofen (ADVIL,MOTRIN) 600 MG tablet Take 1 tablet (600 mg total) by mouth  every 6 (six) hours as needed. 30 tablet 0  . levETIRAcetam (KEPPRA) 750 MG tablet Take 750 mg by mouth 2 (two) times daily.    . methocarbamol (ROBAXIN) 750 MG tablet     . nabumetone (RELAFEN) 750 MG tablet Take 750 mg by mouth 2 (two) times daily.    Marland Kitchen omeprazole (PRILOSEC) 20 MG capsule Take 20 mg by mouth daily.    Marland Kitchen oxyCODONE-acetaminophen (PERCOCET) 10-325 MG tablet Take 1 tablet by mouth every 6 (six) hours.  0  . tiZANidine (ZANAFLEX) 4 MG capsule Take 4 mg by mouth at bedtime.      No current facility-administered medications on file prior to visit.     Social History   Socioeconomic History  . Marital status: Married    Spouse name: Dawn Richard  . Number of children: 2  . Years of education: 10th  . Highest education level: Not on file  Occupational History    Comment: disabled  Social Needs  . Financial resource strain: Not on file  . Food insecurity:    Worry: Not on file    Inability: Not on file  . Transportation needs:    Medical: Not on file    Non-medical: Not on file  Tobacco Use  . Smoking status: Never Smoker  . Smokeless tobacco: Never Used  Substance and Sexual Activity  . Alcohol use: Yes    Alcohol/week: 1.0  standard drinks    Types: 1 Cans of beer per week    Comment: OCC  . Drug use: No  . Sexual activity: Not on file  Lifestyle  . Physical activity:    Days per week: Not on file    Minutes per session: Not on file  . Stress: Not on file  Relationships  . Social connections:    Talks on phone: Not on file    Gets together: Not on file    Attends religious service: Not on file    Active member of club or organization: Not on file    Attends meetings of clubs or organizations: Not on file    Relationship status: Not on file  . Intimate partner violence:    Fear of current or ex partner: Not on file    Emotionally abused: Not on file    Physically abused: Not on file    Forced sexual activity: Not on file  Other Topics Concern  . Not on  file  Social History Narrative   Patient lives at home with her husband Dawn Richard).   Patient is disabled.   Education 10 th grade.   Right handed.   Caffeine - soda five cups daily.    Family History  Problem Relation Age of Onset  . High blood pressure Mother   . High Cholesterol Mother   . Heart disease Father   . High blood pressure Father   . Diabetes Maternal Grandmother   . Diabetes Paternal Grandmother     BP 108/73   Pulse 78   Ht 5\' 2"  (1.575 m)   Wt 173 lb (78.5 kg)   LMP 02/11/2012   BMI 31.64 kg/m   Body mass index is 31.64 kg/m.      Objective:   Physical Exam Constitutional:      Appearance: She is well-developed.  HENT:     Head: Normocephalic and atraumatic.  Eyes:     Conjunctiva/sclera: Conjunctivae normal.     Pupils: Pupils are equal, round, and reactive to light.  Neck:     Musculoskeletal: Normal range of motion and neck supple.  Cardiovascular:     Rate and Rhythm: Normal rate and regular rhythm.  Pulmonary:     Effort: Pulmonary effort is normal.  Abdominal:     Palpations: Abdomen is soft.  Musculoskeletal:     Left hand: She exhibits decreased range of motion and tenderness.       Hands:  Skin:    General: Skin is warm and dry.  Neurological:     Mental Status: She is alert and oriented to person, place, and time.     Cranial Nerves: No cranial nerve deficit.     Motor: No abnormal muscle tone.     Coordination: Coordination normal.     Deep Tendon Reflexes: Reflexes are normal and symmetric. Reflexes normal.  Psychiatric:        Behavior: Behavior normal.        Thought Content: Thought content normal.        Judgment: Judgment normal.     X-rays done left ring finger, reported separately.  Intra-articular fracture left DIP middle phalanx, perhaps very slightly more displacement.      Assessment & Plan:   Encounter Diagnoses  Name Primary?  . Closed displaced fracture of middle phalanx of left ring finger, initial  encounter Yes  . MS (multiple sclerosis) (HCC)   . Chronic pain disorder    I will have  hand surgeon see her.  She Hor need surgery. I have told her this and findings of x-rays.  A new splint for finger applied.  To see hand surgeon.  Electronically Signed Darreld McleanWayne Michelene Keniston, MD 2/4/202010:03 AM

## 2018-06-02 ENCOUNTER — Other Ambulatory Visit: Payer: Self-pay

## 2018-06-02 ENCOUNTER — Other Ambulatory Visit: Payer: Self-pay | Admitting: Orthopedic Surgery

## 2018-06-02 ENCOUNTER — Encounter (HOSPITAL_BASED_OUTPATIENT_CLINIC_OR_DEPARTMENT_OTHER): Payer: Self-pay | Admitting: *Deleted

## 2018-06-03 ENCOUNTER — Other Ambulatory Visit: Payer: Self-pay

## 2018-06-03 ENCOUNTER — Encounter (HOSPITAL_BASED_OUTPATIENT_CLINIC_OR_DEPARTMENT_OTHER)
Admission: RE | Disposition: A | Discharge status: Home / Self Care | Payer: Self-pay | Source: Home / Self Care | Attending: Orthopedic Surgery

## 2018-06-03 ENCOUNTER — Ambulatory Visit (HOSPITAL_BASED_OUTPATIENT_CLINIC_OR_DEPARTMENT_OTHER): Payer: Medicaid Other | Admitting: Anesthesiology

## 2018-06-03 ENCOUNTER — Ambulatory Visit (HOSPITAL_BASED_OUTPATIENT_CLINIC_OR_DEPARTMENT_OTHER)
Admission: RE | Admit: 2018-06-03 | Discharge: 2018-06-03 | Disposition: A | Discharge status: Home / Self Care | Payer: Medicaid Other | Attending: Orthopedic Surgery | Admitting: Orthopedic Surgery

## 2018-06-03 ENCOUNTER — Encounter (HOSPITAL_BASED_OUTPATIENT_CLINIC_OR_DEPARTMENT_OTHER): Payer: Self-pay

## 2018-06-03 DIAGNOSIS — X58XXXA Exposure to other specified factors, initial encounter: Secondary | ICD-10-CM | POA: Insufficient documentation

## 2018-06-03 DIAGNOSIS — G35 Multiple sclerosis: Secondary | ICD-10-CM | POA: Diagnosis not present

## 2018-06-03 DIAGNOSIS — S62625A Displaced fracture of medial phalanx of left ring finger, initial encounter for closed fracture: Secondary | ICD-10-CM | POA: Diagnosis not present

## 2018-06-03 HISTORY — PX: CLOSED REDUCTION FINGER WITH PERCUTANEOUS PINNING: SHX5612

## 2018-06-03 SURGERY — CLOSED REDUCTION, FINGER, WITH PERCUTANEOUS PINNING
Anesthesia: Regional | Site: Finger | Laterality: Left

## 2018-06-03 MED ORDER — LIDOCAINE HCL (CARDIAC) PF 100 MG/5ML IV SOSY
PREFILLED_SYRINGE | INTRAVENOUS | Status: DC | PRN
  Administered 2018-06-03: 50 mg via INTRAVENOUS

## 2018-06-03 MED ORDER — ONDANSETRON HCL 4 MG/2ML IJ SOLN
INTRAMUSCULAR | Status: DC | PRN
  Administered 2018-06-03: 4 mg via INTRAVENOUS

## 2018-06-03 MED ORDER — MIDAZOLAM HCL 2 MG/2ML IJ SOLN
1.0000 mg | INTRAMUSCULAR | Status: DC | PRN
  Administered 2018-06-03: 2 mg via INTRAVENOUS

## 2018-06-03 MED ORDER — FENTANYL CITRATE (PF) 100 MCG/2ML IJ SOLN
INTRAMUSCULAR | Status: AC
  Filled 2018-06-03: qty 2

## 2018-06-03 MED ORDER — PROPOFOL 500 MG/50ML IV EMUL
INTRAVENOUS | Status: DC | PRN
  Administered 2018-06-03: 250 ug/kg/min via INTRAVENOUS

## 2018-06-03 MED ORDER — BUPIVACAINE-EPINEPHRINE (PF) 0.5% -1:200000 IJ SOLN
INTRAMUSCULAR | Status: DC | PRN
  Administered 2018-06-03: 30 mL via PERINEURAL

## 2018-06-03 MED ORDER — HYDROMORPHONE HCL 1 MG/ML IJ SOLN: 0.2500 mg | INTRAMUSCULAR | Status: DC | PRN

## 2018-06-03 MED ORDER — SCOPOLAMINE 1 MG/3DAYS TD PT72: 1.0000 | MEDICATED_PATCH | Freq: Once | TRANSDERMAL | Status: DC | PRN

## 2018-06-03 MED ORDER — ONDANSETRON HCL 4 MG/2ML IJ SOLN: 4.0000 mg | Freq: Once | INTRAMUSCULAR | Status: DC | PRN

## 2018-06-03 MED ORDER — MEPERIDINE HCL 25 MG/ML IJ SOLN: 6.2500 mg | INTRAMUSCULAR | Status: DC | PRN

## 2018-06-03 MED ORDER — LACTATED RINGERS IV SOLN
INTRAVENOUS | Status: DC
  Administered 2018-06-03: 14:00:00 via INTRAVENOUS

## 2018-06-03 MED ORDER — CEFAZOLIN SODIUM-DEXTROSE 2-4 GM/100ML-% IV SOLN
2.0000 g | INTRAVENOUS | Status: AC
  Administered 2018-06-03: 2 g via INTRAVENOUS

## 2018-06-03 MED ORDER — CEFAZOLIN SODIUM-DEXTROSE 2-4 GM/100ML-% IV SOLN
INTRAVENOUS | Status: AC
  Filled 2018-06-03: qty 100

## 2018-06-03 MED ORDER — CHLORHEXIDINE GLUCONATE 4 % EX LIQD: 60.0000 mL | Freq: Once | CUTANEOUS | Status: DC

## 2018-06-03 MED ORDER — TRAMADOL HCL 50 MG PO TABS: 50.0000 mg | ORAL_TABLET | Freq: Four times a day (QID) | ORAL | 0 refills | Status: DC | PRN

## 2018-06-03 MED ORDER — FENTANYL CITRATE (PF) 100 MCG/2ML IJ SOLN
50.0000 ug | INTRAMUSCULAR | Status: DC | PRN
  Administered 2018-06-03: 100 ug via INTRAVENOUS

## 2018-06-03 MED ORDER — MIDAZOLAM HCL 2 MG/2ML IJ SOLN
INTRAMUSCULAR | Status: AC
  Filled 2018-06-03: qty 2

## 2018-06-03 MED ORDER — DEXAMETHASONE SODIUM PHOSPHATE 10 MG/ML IJ SOLN
INTRAMUSCULAR | Status: DC | PRN
  Administered 2018-06-03: 5 mg via INTRAVENOUS

## 2018-06-03 SURGICAL SUPPLY — 54 items
BLADE MINI RND TIP GREEN BEAV (BLADE) IMPLANT
BLADE SURG 15 STRL LF DISP TIS (BLADE) ×1 IMPLANT
BLADE SURG 15 STRL SS (BLADE)
BNDG CMPR 9X4 STRL LF SNTH (GAUZE/BANDAGES/DRESSINGS)
BNDG COHESIVE 3X5 TAN STRL LF (GAUZE/BANDAGES/DRESSINGS) ×3 IMPLANT
BNDG ESMARK 4X9 LF (GAUZE/BANDAGES/DRESSINGS) IMPLANT
BNDG GAUZE ELAST 4 BULKY (GAUZE/BANDAGES/DRESSINGS) IMPLANT
CHLORAPREP W/TINT 26ML (MISCELLANEOUS) ×3 IMPLANT
CORD BIPOLAR FORCEPS 12FT (ELECTRODE) IMPLANT
COVER BACK TABLE 60X90IN (DRAPES) ×3 IMPLANT
COVER MAYO STAND STRL (DRAPES) ×3 IMPLANT
COVER WAND RF STERILE (DRAPES) IMPLANT
CUFF TOURNIQUET SINGLE 18IN (TOURNIQUET CUFF) IMPLANT
DECANTER SPIKE VIAL GLASS SM (MISCELLANEOUS) IMPLANT
DRAPE EXTREMITY T 121X128X90 (DISPOSABLE) ×3 IMPLANT
DRAPE OEC MINIVIEW 54X84 (DRAPES) ×3 IMPLANT
DRAPE SURG 17X23 STRL (DRAPES) ×3 IMPLANT
GAUZE SPONGE 4X4 12PLY STRL (GAUZE/BANDAGES/DRESSINGS) ×3 IMPLANT
GAUZE XEROFORM 1X8 LF (GAUZE/BANDAGES/DRESSINGS) ×3 IMPLANT
GLOVE BIO SURGEON STRL SZ 6.5 (GLOVE) ×1 IMPLANT
GLOVE BIO SURGEON STRL SZ7.5 (GLOVE) ×2 IMPLANT
GLOVE BIO SURGEONS STRL SZ 6.5 (GLOVE) ×1
GLOVE BIOGEL PI IND STRL 7.0 (GLOVE) IMPLANT
GLOVE BIOGEL PI IND STRL 8 (GLOVE) IMPLANT
GLOVE BIOGEL PI IND STRL 8.5 (GLOVE) ×1 IMPLANT
GLOVE BIOGEL PI INDICATOR 7.0 (GLOVE) ×2
GLOVE BIOGEL PI INDICATOR 8 (GLOVE) ×2
GLOVE BIOGEL PI INDICATOR 8.5 (GLOVE) ×2
GLOVE SURG ORTHO 8.0 STRL STRW (GLOVE) ×3 IMPLANT
GOWN STRL REUS W/ TWL LRG LVL3 (GOWN DISPOSABLE) IMPLANT
GOWN STRL REUS W/TWL LRG LVL3 (GOWN DISPOSABLE)
GOWN STRL REUS W/TWL XL LVL3 (GOWN DISPOSABLE) ×5 IMPLANT
K-WIRE PROS .028 4 (WIRE) ×2 IMPLANT
NDL PRECISIONGLIDE 27X1.5 (NEEDLE) IMPLANT
NEEDLE PRECISIONGLIDE 27X1.5 (NEEDLE) IMPLANT
NS IRRIG 1000ML POUR BTL (IV SOLUTION) IMPLANT
PACK BASIN DAY SURGERY FS (CUSTOM PROCEDURE TRAY) ×3 IMPLANT
PAD CAST 3X4 CTTN HI CHSV (CAST SUPPLIES) ×1 IMPLANT
PADDING CAST ABS 4INX4YD NS (CAST SUPPLIES) ×2
PADDING CAST ABS COTTON 4X4 ST (CAST SUPPLIES) ×1 IMPLANT
PADDING CAST COTTON 3X4 STRL (CAST SUPPLIES) ×3
SLEEVE SCD COMPRESS KNEE MED (MISCELLANEOUS) IMPLANT
SPLINT FINGER 4.25 BULB 911906 (SOFTGOODS) ×2 IMPLANT
SPLINT PLASTER CAST XFAST 3X15 (CAST SUPPLIES) IMPLANT
SPLINT PLASTER XTRA FASTSET 3X (CAST SUPPLIES)
STOCKINETTE 4X48 STRL (DRAPES) ×3 IMPLANT
SUT CHROMIC 5 0 P 3 (SUTURE) IMPLANT
SUT ETHILON 4 0 PS 2 18 (SUTURE) ×3 IMPLANT
SUT MERSILENE 4 0 P 3 (SUTURE) IMPLANT
SUT VICRYL 4-0 PS2 18IN ABS (SUTURE) IMPLANT
SYR BULB 3OZ (MISCELLANEOUS) IMPLANT
SYR CONTROL 10ML LL (SYRINGE) IMPLANT
TOWEL GREEN STERILE FF (TOWEL DISPOSABLE) ×3 IMPLANT
UNDERPAD 30X30 (UNDERPADS AND DIAPERS) ×3 IMPLANT

## 2018-06-03 NOTE — Discharge Instructions (Addendum)
Hand Center Instructions °Hand Surgery ° °Wound Care: °Keep your hand elevated above the level of your heart.  Do not allow it to dangle by your side.  Keep the dressing dry and do not remove it unless your doctor advises you to do so.  He will usually change it at the time of your post-op visit.  Moving your fingers is advised to stimulate circulation but will depend on the site of your surgery.  If you have a splint applied, your doctor will advise you regarding movement. ° °Activity: °Do not drive or operate machinery today.  Rest today and then you Heisler return to your normal activity and work as indicated by your physician. ° °Diet:  °Drink liquids today or eat a light diet.  You Melling resume a regular diet tomorrow.   ° °General expectations: °Pain for two to three days. °Fingers Shoff become slightly swollen. ° °Call your doctor if any of the following occur: °Severe pain not relieved by pain medication. °Elevated temperature. °Dressing soaked with blood. °Inability to move fingers. °White or bluish color to fingers. ° ° °Post Anesthesia Home Care Instructions ° °Activity: °Get plenty of rest for the remainder of the day. A responsible individual must stay with you for 24 hours following the procedure.  °For the next 24 hours, DO NOT: °-Drive a car °-Operate machinery °-Drink alcoholic beverages °-Take any medication unless instructed by your physician °-Make any legal decisions or sign important papers. ° °Meals: °Start with liquid foods such as gelatin or soup. Progress to regular foods as tolerated. Avoid greasy, spicy, heavy foods. If nausea and/or vomiting occur, drink only clear liquids until the nausea and/or vomiting subsides. Call your physician if vomiting continues. ° °Special Instructions/Symptoms: °Your throat Iannuzzi feel dry or sore from the anesthesia or the breathing tube placed in your throat during surgery. If this causes discomfort, gargle with warm salt water. The discomfort should disappear within  24 hours. ° °If you had a scopolamine patch placed behind your ear for the management of post- operative nausea and/or vomiting: ° °1. The medication in the patch is effective for 72 hours, after which it should be removed.  Wrap patch in a tissue and discard in the trash. Wash hands thoroughly with soap and water. °2. You Pavon remove the patch earlier than 72 hours if you experience unpleasant side effects which Abello include dry mouth, dizziness or visual disturbances. °3. Avoid touching the patch. Wash your hands with soap and water after contact with the patch. ° °Regional Anesthesia Blocks ° °1. Numbness or the inability to move the "blocked" extremity Grumbine last from 3-48 hours after placement. The length of time depends on the medication injected and your individual response to the medication. If the numbness is not going away after 48 hours, call your surgeon. ° °2. The extremity that is blocked will need to be protected until the numbness is gone and the  Strength has returned. Because you cannot feel it, you will need to take extra care to avoid injury. Because it Pfefferle be weak, you Woolworth have difficulty moving it or using it. You Claros not know what position it is in without looking at it while the block is in effect. ° °3. For blocks in the legs and feet, returning to weight bearing and walking needs to be done carefully. You will need to wait until the numbness is entirely gone and the strength has returned. You should be able to move your leg and foot   normally before you try and bear weight or walk. You will need someone to be with you when you first try to ensure you do not fall and possibly risk injury. ° °4. Bruising and tenderness at the needle site are common side effects and will resolve in a few days. ° °5. Persistent numbness or new problems with movement should be communicated to the surgeon or the [facility] ([phone])/ [facility] ([phone]). °  ° °

## 2018-06-03 NOTE — Op Note (Signed)
X-ray note:  Using an image intensification in the left ring finger middle phalanx fracture was manipulated and reduced.  With stabilization this was then pinned with 2 cross K wires which lied in good position and intersecting the bone proximally.  The joint surface appeared anatomic with realignment and on a longitudinal sagittal plane.

## 2018-06-03 NOTE — Op Note (Addendum)
NAME: Dawn Richard MEDICAL RECORD NO: [id number] DATE OF BIRTH: 1971-03-24 FACILITY: [facility] LOCATION: [facility] PHYSICIAN: Nicki Reaper, MD   OPERATIVE REPORT   DATE OF PROCEDURE: 06/03/18    PREOPERATIVE DIAGNOSIS:   Intra-articular fracture middle phalanx distal phalangeal joint left ring finger   POSTOPERATIVE DIAGNOSIS:   Same   PROCEDURE:   Closed reduction percutaneous pinning intra-articular fracture middle phalanx left ring finger   SURGEON: Cindee Salt, M.D.   ASSISTANT: Betha Loa, MD   ANESTHESIA:  Regional with sedation   INTRAVENOUS FLUIDS:  Per anesthesia flow sheet.   ESTIMATED BLOOD LOSS:  Minimal.   COMPLICATIONS:  None.   SPECIMENS:  none   TOURNIQUET TIME:   * Missing tourniquet times found for documented tourniquets in log: [id number] *   DISPOSITION:  Stable to PACU.   INDICATIONS: Patient is a 48 year old female sustained a fracture of her left ring finger distal phalangeal joint x-rays revealed a comminuted fracture of the articular surface of the middle phalanx.  This is displaced.  She has elected undergo pinning possible open reduction internal fixation.  Pre-peri-and postoperative course been discussed along with the risks and complications.  She is aware that there is no guarantee to the surgery the possibility of infection recurrence injury to arteries nerves tendons complete relief symptoms distally the possibility of stiffness arthritic change even the necrosis of the condyles due to the fracture.  In the preoperative area the patient is seen the extremity marked by both patient and surgeon antibiotic given  OPERATIVE COURSE: Patient is brought to the operating room after supraclavicular block was carried out without difficulty in the preoperative area under the direction of the anesthesia department.  She was prepped and draped in the supine position with the left arm free.  A three-minute dry time was allowed timeout taken to  confirm patient procedure.  The fracture was manipulated reduced under image intensification.  This was then pinned with 2 crossed 0.28 K wires in a an anatomical position.  The pins were bent cut short.  Sterile compressive dressing and splint to the finger was applied.  No tourniquet was used.  She tolerated the procedure well was taken to the recovery room for observation in satisfactory condition.  Will be discharged home to return the [facility] in 1 week on Tylenol ibuprofen with Ultram as a backup.   Cindee Salt, MD Electronically signed, 06/03/18

## 2018-06-03 NOTE — Brief Op Note (Signed)
06/03/2018  2:58 PM  PATIENT:  Dawn Richard  47 y.o. female  PRE-OPERATIVE DIAGNOSIS:  Fracture LEFT MIDDLE FINGER  POST-OPERATIVE DIAGNOSIS:  Fracture LEFT MIDDLE FINGER  PROCEDURE:  Procedure(s): CLOSED REDUCTION and pinning left index FINGER (Left)  SURGEON:  Surgeon(s) and Role:    * Cindee Salt, MD - Primary    * Betha Loa, MD - Assisting  PHYSICIAN ASSISTANT:   ASSISTANTS: K Reyna Lorenzi,MD   ANESTHESIA:   regional and IV sedation  EBL:  1 mL   BLOOD ADMINISTERED:none  DRAINS: none   LOCAL MEDICATIONS USED:  NONE  SPECIMEN:  No Specimen  DISPOSITION OF SPECIMEN:  N/A  COUNTS:  YES  TOURNIQUET:  * Missing tourniquet times found for documented tourniquets in log: [id number] *  DICTATION: .Dragon Dictation  PLAN OF CARE: Discharge to home after PACU  PATIENT DISPOSITION:  PACU - hemodynamically stable.

## 2018-06-03 NOTE — Op Note (Signed)
I assisted Surgeon(s) and Role:    * Cindee Salt, MD - Primary    Betha Loa, MD - Assisting on the Procedure(s): CLOSED REDUCTION and pinning left index FINGER on 06/03/2018.  I provided assistance on this case as follows: placement hardware.  Electronically signed by: Betha Loa, MD Date: 06/03/2018 Time: 2:58 PM
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
## 2018-06-03 NOTE — Anesthesia Procedure Notes (Signed)
Anesthesia Regional Block: Supraclavicular block   Pre-Anesthetic Checklist: ,, timeout performed, Correct Patient, Correct Site, Correct Laterality, Correct Procedure, Correct Position, site marked, Risks and benefits discussed,  Surgical consent,  Pre-op evaluation,  At surgeon's request and post-op pain management  Laterality: Left  Prep: chloraprep       Needles:   Needle Type: Echogenic Stimulator Needle     Needle Length: 9cm  Needle Gauge: 21     Additional Needles:   Procedures:, nerve stimulator,,,,,,,   Nerve Stimulator or Paresthesia:  Response: 0.4 mA,   Additional Responses:   Narrative:  Start time: 06/03/2018 1:55 PM End time: 06/03/2018 2:05 PM Injection made incrementally with aspirations every 5 mL.  Performed by: Personally  Anesthesiologist: Arta Bruce, MD  Additional Notes: Monitors applied. Patient sedated. Sterile prep and drape,hand hygiene and sterile gloves were used. Relevant anatomy identified.Needle position confirmed.Local anesthetic injected incrementally after negative aspiration. Local anesthetic spread visualized around nerve(s). Vascular puncture avoided. No complications. Image printed for medical record.The patient tolerated the procedure well.

## 2018-06-03 NOTE — Anesthesia Preprocedure Evaluation (Signed)
Anesthesia Evaluation  Patient identified by MRN, date of birth, ID band Patient awake    Reviewed: Allergy & Precautions, NPO status , Patient's Chart, lab work & pertinent test results  Airway Mallampati: I  TM Distance: >3 FB Neck ROM: Full    Dental   Pulmonary    Pulmonary exam normal        Cardiovascular Normal cardiovascular exam     Neuro/Psych Seizures -,  Anxiety Depression    GI/Hepatic   Endo/Other    Renal/GU      Musculoskeletal   Abdominal   Peds  Hematology   Anesthesia Other Findings   Reproductive/Obstetrics                             Anesthesia Physical Anesthesia Plan  ASA: II  Anesthesia Plan: Regional   Post-op Pain Management:    Induction: Intravenous  PONV Risk Score and Plan: 2 and Treatment Lienau vary due to age or medical condition and Ondansetron  Airway Management Planned: Simple Face Mask  Additional Equipment:   Intra-op Plan:   Post-operative Plan:   Informed Consent: I have reviewed the patients History and Physical, chart, labs and discussed the procedure including the risks, benefits and alternatives for the proposed anesthesia with the patient or authorized representative who has indicated his/her understanding and acceptance.       Plan Discussed with: CRNA and Surgeon  Anesthesia Plan Comments:         Anesthesia Quick Evaluation

## 2018-06-03 NOTE — Progress Notes (Signed)
Assisted Dr. Ossey with left, ultrasound guided, supraclavicular block. Side rails up, monitors on throughout procedure. See vital signs in flow sheet. Tolerated Procedure well. 

## 2018-06-04 NOTE — Transfer of Care (Signed)
Immediate Anesthesia Transfer of Care Note  Patient: Dawn Richard  Procedure(s) Performed: Closed reduction and pinning Patient Location:PACU  Anesthesia Type:REGIONAL  Level of Consciousness:Awake  Airway & Oxygen Therapy: Nasal Cannulae  Post-op Assessment: Stable  Post vital signs: Stable  Last Vitals:  Vitals Value Taken Time  BP Stable   Temp Stable   Pulse Stable   Resp Stable   SpO2 100     Last Pain:  Vitals:   06/03/18 1556  TempSrc:   PainSc: 0-No pain         Complications: None

## 2018-06-04 NOTE — Anesthesia Postprocedure Evaluation (Signed)
Anesthesia Post Note  Patient: Dawn Richard  Procedure(s) Performed: CLOSED REDUCTION and pinning left index FINGER (Left Finger)     Patient location during evaluation: PACU Anesthesia Type: Regional Level of consciousness: awake and alert and patient cooperative Pain management: pain level controlled Vital Signs Assessment: post-procedure vital signs reviewed and stable Respiratory status: spontaneous breathing and respiratory function stable Cardiovascular status: stable Anesthetic complications: no    Last Vitals:  Vitals:   06/03/18 1530 06/03/18 1556  BP:  138/81  Pulse: 88 86  Resp: 12 18  Temp:  (!) 36.4 C  SpO2: 99% 99%    Last Pain:  Vitals:   06/03/18 1556  TempSrc:   PainSc: 0-No pain                 Denali Becvar DAVID

## 2018-06-06 ENCOUNTER — Encounter (HOSPITAL_BASED_OUTPATIENT_CLINIC_OR_DEPARTMENT_OTHER): Payer: Self-pay | Admitting: Orthopedic Surgery

## 2021-08-18 ENCOUNTER — Observation Stay (HOSPITAL_COMMUNITY)
Admission: EM | Admit: 2021-08-18 | Discharge: 2021-08-19 | Disposition: A | Discharge status: Home / Self Care | Payer: Medicaid Other | Attending: Internal Medicine | Admitting: Internal Medicine

## 2021-08-18 ENCOUNTER — Emergency Department (HOSPITAL_COMMUNITY): Payer: Medicaid Other

## 2021-08-18 ENCOUNTER — Other Ambulatory Visit: Payer: Self-pay

## 2021-08-18 DIAGNOSIS — G35 Multiple sclerosis: Secondary | ICD-10-CM | POA: Diagnosis not present

## 2021-08-18 DIAGNOSIS — T1491XA Suicide attempt, initial encounter: Secondary | ICD-10-CM | POA: Diagnosis not present

## 2021-08-18 DIAGNOSIS — Z79899 Other long term (current) drug therapy: Secondary | ICD-10-CM | POA: Insufficient documentation

## 2021-08-18 DIAGNOSIS — F419 Anxiety disorder, unspecified: Secondary | ICD-10-CM | POA: Diagnosis not present

## 2021-08-18 DIAGNOSIS — G43909 Migraine, unspecified, not intractable, without status migrainosus: Secondary | ICD-10-CM | POA: Diagnosis not present

## 2021-08-18 DIAGNOSIS — Y999 Unspecified external cause status: Secondary | ICD-10-CM | POA: Diagnosis not present

## 2021-08-18 DIAGNOSIS — M549 Dorsalgia, unspecified: Secondary | ICD-10-CM | POA: Diagnosis present

## 2021-08-18 DIAGNOSIS — Y939 Activity, unspecified: Secondary | ICD-10-CM | POA: Diagnosis not present

## 2021-08-18 DIAGNOSIS — M5136 Other intervertebral disc degeneration, lumbar region: Secondary | ICD-10-CM | POA: Insufficient documentation

## 2021-08-18 DIAGNOSIS — M51369 Other intervertebral disc degeneration, lumbar region without mention of lumbar back pain or lower extremity pain: Secondary | ICD-10-CM | POA: Diagnosis present

## 2021-08-18 DIAGNOSIS — T50902A Poisoning by unspecified drugs, medicaments and biological substances, intentional self-harm, initial encounter: Principal | ICD-10-CM

## 2021-08-18 DIAGNOSIS — D509 Iron deficiency anemia, unspecified: Secondary | ICD-10-CM | POA: Diagnosis not present

## 2021-08-18 DIAGNOSIS — R569 Unspecified convulsions: Secondary | ICD-10-CM | POA: Diagnosis not present

## 2021-08-18 DIAGNOSIS — G9341 Metabolic encephalopathy: Secondary | ICD-10-CM | POA: Diagnosis not present

## 2021-08-18 DIAGNOSIS — G894 Chronic pain syndrome: Secondary | ICD-10-CM | POA: Diagnosis not present

## 2021-08-18 DIAGNOSIS — M797 Fibromyalgia: Secondary | ICD-10-CM | POA: Diagnosis present

## 2021-08-18 DIAGNOSIS — K529 Noninfective gastroenteritis and colitis, unspecified: Secondary | ICD-10-CM | POA: Diagnosis present

## 2021-08-18 DIAGNOSIS — D649 Anemia, unspecified: Secondary | ICD-10-CM | POA: Diagnosis present

## 2021-08-18 LAB — CBC WITH DIFFERENTIAL/PLATELET
Abs Immature Granulocytes: 0.02 10*3/uL (ref 0.00–0.07)
Basophils Absolute: 0 10*3/uL (ref 0.0–0.1)
Basophils Relative: 0 %
Eosinophils Absolute: 0 10*3/uL (ref 0.0–0.5)
Eosinophils Relative: 0 %
HCT: 39.1 % (ref 36.0–46.0)
Hemoglobin: 12.8 g/dL (ref 12.0–15.0)
Immature Granulocytes: 0 %
Lymphocytes Relative: 9 %
Lymphs Abs: 0.5 10*3/uL — ABNORMAL LOW (ref 0.7–4.0)
MCH: 28.3 pg (ref 26.0–34.0)
MCHC: 32.7 g/dL (ref 30.0–36.0)
MCV: 86.5 fL (ref 80.0–100.0)
Monocytes Absolute: 0.1 10*3/uL (ref 0.1–1.0)
Monocytes Relative: 2 %
Neutro Abs: 4.9 10*3/uL (ref 1.7–7.7)
Neutrophils Relative %: 89 %
Platelets: 281 10*3/uL (ref 150–400)
RBC: 4.52 MIL/uL (ref 3.87–5.11)
RDW: 13.1 % (ref 11.5–15.5)
WBC: 5.5 10*3/uL (ref 4.0–10.5)
nRBC: 0 % (ref 0.0–0.2)

## 2021-08-18 LAB — COMPREHENSIVE METABOLIC PANEL
ALT: 9 U/L (ref 0–44)
AST: 20 U/L (ref 15–41)
Albumin: 4.2 g/dL (ref 3.5–5.0)
Alkaline Phosphatase: 112 U/L (ref 38–126)
Anion gap: 11 (ref 5–15)
BUN: 18 mg/dL (ref 6–20)
CO2: 21 mmol/L — ABNORMAL LOW (ref 22–32)
Calcium: 9 mg/dL (ref 8.9–10.3)
Chloride: 108 mmol/L (ref 98–111)
Creatinine, Ser: 1.08 mg/dL — ABNORMAL HIGH (ref 0.44–1.00)
GFR, Estimated: >60 mL/min (ref >60)
Glucose, Bld: 166 mg/dL — ABNORMAL HIGH (ref 70–99)
Potassium: 3.9 mmol/L (ref 3.5–5.1)
Sodium: 140 mmol/L (ref 135–145)
Total Bilirubin: 3.7 mg/dL — ABNORMAL HIGH (ref 0.3–1.2)
Total Protein: 7.2 g/dL (ref 6.5–8.1)

## 2021-08-18 LAB — ACETAMINOPHEN LEVEL: Acetaminophen (Tylenol), Serum: <10 ug/mL — ABNORMAL LOW (ref 10–30)

## 2021-08-18 LAB — ETHANOL: Alcohol, Ethyl (B): <10 mg/dL (ref <10)

## 2021-08-18 LAB — SALICYLATE LEVEL: Salicylate Lvl: <7 mg/dL — ABNORMAL LOW (ref 7.0–30.0)

## 2021-08-18 LAB — MRSA NEXT GEN BY PCR, NASAL: MRSA by PCR Next Gen: DETECTED — AB

## 2021-08-18 LAB — CBG MONITORING, ED: Glucose-Capillary: 145 mg/dL — ABNORMAL HIGH (ref 70–99)

## 2021-08-18 MED ORDER — PROMETHAZINE HCL 25 MG/ML IJ SOLN: 6.2500 mg | Freq: Three times a day (TID) | INTRAMUSCULAR | Status: DC | PRN

## 2021-08-18 MED ORDER — SODIUM CHLORIDE 0.9 % IV BOLUS
1000.0000 mL | Freq: Once | INTRAVENOUS | Status: AC
  Administered 2021-08-18: 1000 mL via INTRAVENOUS

## 2021-08-18 MED ORDER — SODIUM CHLORIDE 0.9 % IV SOLN: INTRAVENOUS | Status: DC

## 2021-08-18 MED ORDER — CHLORHEXIDINE GLUCONATE CLOTH 2 % EX PADS
6.0000 | MEDICATED_PAD | Freq: Every day | CUTANEOUS | Status: DC
  Administered 2021-08-18: 6 via TOPICAL

## 2021-08-18 MED ORDER — IBUPROFEN 400 MG PO TABS
400.0000 mg | ORAL_TABLET | ORAL | Status: DC | PRN
Start: 2021-08-18 — End: 2021-08-19

## 2021-08-18 MED ORDER — ONDANSETRON HCL 4 MG/2ML IJ SOLN
INTRAMUSCULAR | Status: AC
  Administered 2021-08-18: 2 mg
  Filled 2021-08-18: qty 2

## 2021-08-18 MED ORDER — ONDANSETRON HCL 4 MG/2ML IJ SOLN: 4.0000 mg | Freq: Four times a day (QID) | INTRAMUSCULAR | Status: DC | PRN

## 2021-08-18 MED ORDER — CHLORHEXIDINE GLUCONATE CLOTH 2 % EX PADS
6.0000 | MEDICATED_PAD | Freq: Every day | CUTANEOUS | Status: DC
  Administered 2021-08-19: 6 via TOPICAL

## 2021-08-18 MED ORDER — MUPIROCIN 2 % EX OINT
1.0000 "application " | TOPICAL_OINTMENT | Freq: Two times a day (BID) | CUTANEOUS | Status: DC
  Administered 2021-08-18 – 2021-08-19 (×2): 1 via NASAL
  Filled 2021-08-18: qty 22

## 2021-08-18 NOTE — H&P (Signed)
?History and Physical  ? ? ?Dawn Richard DHR:[id number] DOB: 04/20/1971 DOA: 08/18/2021 ? ?PCP: Kirstie Peri, MD  ? ?Chief Complaint: Intentional overdose/suicidal ideation ? ?HPI: 53 F Dawn Richard is a 51 y.o. female with medical history significant of GERD, MS, Migraines, seizure disorder, depression/anxiety, fibromyalgia, lumbago and vitamin D deficiency who presents to the ED from home today after intentional overdose of multiple medications including, but likely not limited to nabumetone methocarbamol gabapentin and Keppra.  This was done after a fight with her husband and was done in a suicidal attempt/suicidal ideation response stating "I just cannot take it anymore."  Toxicology was contacted by ED who recommended supportive care and IV fluids and telemetry for the next 24 to 48 hours.  Hospitalist called for admission, psychiatry consulted for further evaluation and work-up in the setting of suicidal ideation and attempt. ? ? ?Review of Systems: As per HPI unless stated as above somewhat limited due to patient's mental status  ? ?Assessment/Plan ?Principal Problem: ?  Drug overdose, intentional, initial encounter (HCC) ?Active Problems: ?  Seizure (HCC) ?  Gastroenteritis ?  Anxiety ?  Back pain ?  Migraine ?  Fibromyalgia affecting multiple sites ?  MS (multiple sclerosis) (HCC) ?  Normocytic anemia ?  Chronic pain disorder ?  Lumbar degenerative disc disease ?  ? ?Intentional overdose, suicidal ideation and attempt, with polypharmacy, POA ?Depression/anxiety ?-Patient denies any history of suicidal ideation or attempt. ?-This was done in retaliation to a fight had with her husband. ?-Continue supportive care including Zofran, Phenergan, IV fluids ?-Continue telemetry for 24 to 48 hours ?-Psychiatry consulted, appreciate insight and recommendations as well as assistance with placement if necessary. ? ?Intractable nausea vomiting secondary to above ?-Improving with IV fluids and Zofran/Phenergan -follow QT  -currently within normal limits ? ?Seizure disorder ?-We will likely need to reinitiate Keppra in the next 24 to 48 hours once she has cleared her overdose ?-Will sideline neurology in the morning after 24 hours for further insight and recommendations ? ?Fibromyalgia, MS, chronic pain disorder, lumbago, chronic ?-We will continue to hold any CNS depressant or narcotic medications in the interim ?-Patient and husband understand ?-Ibuprofen prescribed in the meantime for coverage ? ?DVT prophylaxis: Early ambulation, low risk ?Code Status: Full ?Family Communication: Husband at bedside ?Status is: Inpatient ? ?Dispo: The patient is from: Home ?             Anticipated d/c is to: To be determined ?             Anticipated d/c date is: 48 to 72 hours ?             Patient currently not medically stable for discharge given ongoing need for close monitoring in the setting of polypharmacy overdose, evaluation with psychiatry, and possible placement ? ?Consultants:  ?Psychiatry ? ?Procedures:  ?None ? ? ?Past Medical History:  ?Diagnosis Date  ? Anxiety   ? Arthritis   ? Chronic pain   ? Depression   ? Fibromyalgia   ? Gastroenteritis   ? Low back pain   ? Migraine   ? MS (multiple sclerosis) (HCC)   ? Seizure (HCC)   ? last seizure 06/2016  ? Vitamin D deficiency   ? ? ?Past Surgical History:  ?Procedure Laterality Date  ? BACK SURGERY    ? CLOSED REDUCTION FINGER WITH PERCUTANEOUS PINNING Left 06/03/2018  ? Procedure: CLOSED REDUCTION and pinning left index FINGER;  Surgeon: Cindee Salt, MD;  Location: [facility];  Service: Orthopedics;  Laterality: Left;  ? KNEE SURGERY Left   ? ? ? reports that she has never smoked. She has never used smokeless tobacco. She reports current alcohol use of about 1.0 standard drink per week. She reports that she does not use drugs. ? ?Allergies  ?Allergen Reactions  ? Bee Venom   ?  swelling  ? Doxycycline   ?  "sick"  ? ? ?Family History  ?Problem Relation Age of Onset  ? High  blood pressure Mother   ? High Cholesterol Mother   ? Heart disease Father   ? High blood pressure Father   ? Diabetes Maternal Grandmother   ? Diabetes Paternal Grandmother   ? ? ?Prior to Admission medications   ?Medication Sig Start Date End Date Taking? Authorizing Provider  ?DULoxetine (CYMBALTA) 30 MG capsule Take 30 mg by mouth 2 (two) times daily.    [provider]  ?gabapentin (NEURONTIN) 600 MG tablet Take 600 mg by mouth 3 (three) times daily.  12/06/15   [provider]  ?ibuprofen (ADVIL,MOTRIN) 600 MG tablet Take 1 tablet (600 mg total) by mouth every 6 (six) hours as needed. 05/30/18   Ivery Quale, PA-C  ?levETIRAcetam (KEPPRA) 750 MG tablet Take 750 mg by mouth 2 (two) times daily.    [provider]  ?methocarbamol (ROBAXIN) 750 MG tablet  03/08/18   [provider]  ?nabumetone (RELAFEN) 750 MG tablet Take 750 mg by mouth 2 (two) times daily.    [provider]  ?omeprazole (PRILOSEC) 20 MG capsule Take 20 mg by mouth daily.    [provider]  ?ondansetron (ZOFRAN) 4 MG tablet Take 4 mg by mouth every 8 (eight) hours as needed for nausea or vomiting.    [provider]  ?oxyCODONE-acetaminophen (PERCOCET) 10-325 MG tablet Take 1 tablet by mouth every 6 (six) hours. 01/10/16   [provider]  ?tiZANidine (ZANAFLEX) 4 MG capsule Take 4 mg by mouth at bedtime.     [provider]  ?traMADol (ULTRAM) 50 MG tablet Take 1 tablet (50 mg total) by mouth every 6 (six) hours as needed. 06/03/18   Cindee Salt, MD  ? ? ?Physical Exam: ?Vitals:  ? 08/18/21 1647 08/18/21 1654 08/18/21 1700 08/18/21 1724  ?BP: 99/61  111/65   ?Pulse: 80 69 66 86  ?Resp: 16 13 10  (!) 31  ?Temp: (!) 93.9 ?F (34.4 ?C)   (!) 94.5 ?F (34.7 ?C)  ?TempSrc: Rectal   Rectal  ?SpO2: 100% 100% 98% 96%  ?Weight:      ?Height:      ? ? ?Constitutional: NAD, calm, comfortable ?Vitals:  ? 08/18/21 1647 08/18/21 1654 08/18/21 1700 08/18/21 1724  ?BP: 99/61  111/65    ?Pulse: 80 69 66 86  ?Resp: 16 13 10  (!) 31  ?Temp: (!) 93.9 ?F (34.4 ?C)   (!) 94.5 ?F (34.7 ?C)  ?TempSrc: Rectal   Rectal  ?SpO2: 100% 100% 98% 96%  ?Weight:      ?Height:      ? ?General:  Pleasantly resting in bed, No acute distress. ?HEENT:  Normocephalic atraumatic.  Sclerae nonicteric, noninjected.  Extraocular movements intact bilaterally. ?Neck:  Without mass or deformity.  Trachea is midline. ?Lungs:  Clear to auscultate bilaterally without rhonchi, wheeze, or rales. ?Heart:  Regular rate and rhythm.  Without murmurs, rubs, or gallops. ?Abdomen:  Soft, nontender, nondistended.  Without guarding or rebound. ?Extremities: Without cyanosis, clubbing, edema, or obvious deformity. ?  Vascular:  Dorsalis pedis and posterior tibial pulses palpable bilaterally. ?Skin:  Warm and dry, no erythema, no ulcerations. ? ?Labs on Admission: I have personally reviewed following labs and imaging studies ? ?CBC: ?Recent Labs  ?Lab 08/18/21 ?1303  ?WBC 5.5  ?NEUTROABS 4.9  ?HGB 12.8  ?HCT 39.1  ?MCV 86.5  ?PLT 281  ? ?Basic Metabolic Panel: ?Recent Labs  ?Lab 08/18/21 ?1303  ?NA 140  ?K 3.9  ?CL 108  ?CO2 21*  ?GLUCOSE 166*  ?BUN 18  ?CREATININE 1.08*  ?CALCIUM 9.0  ? ?GFR: ?Estimated Creatinine Clearance: 60.3 mL/min (A) (by C-G formula based on SCr of 1.08 mg/dL (H)). ?Liver Function Tests: ?Recent Labs  ?Lab 08/18/21 ?1303  ?AST 20  ?ALT 9  ?ALKPHOS 112  ?BILITOT 3.7*  ?PROT 7.2  ?ALBUMIN 4.2  ? ?No results for input(s): LIPASE, AMYLASE in the last 168 hours. ?No results for input(s): AMMONIA in the last 168 hours. ?Coagulation Profile: ?No results for input(s): INR, PROTIME in the last 168 hours. ?Cardiac Enzymes: ?No results for input(s): CKTOTAL, CKMB, CKMBINDEX, TROPONINI in the last 168 hours. ?BNP (last 3 results) ?No results for input(s): PROBNP in the last 8760 hours. ?HbA1C: ?No results for input(s): HGBA1C in the last 72 hours. ?CBG: ?Recent Labs  ?Lab 08/18/21 ?1300  ?GLUCAP 145*  ? ?Lipid Profile: ?No results  for input(s): CHOL, HDL, LDLCALC, TRIG, CHOLHDL, LDLDIRECT in the last 72 hours. ?Thyroid Function Tests: ?No results for input(s): TSH, T4TOTAL, FREET4, T3FREE, THYROIDAB in the last 72 hours. ?Anemia Panel

## 2021-08-18 NOTE — Progress Notes (Signed)
Date and time results received: 08/18/21 1840 ?(use smartphrase ".now" to insert current time) ? ?Test: MRSA PCR ?Critical Value: positive ? ?Name of Provider Notified: Avon Gully, MD. ? ?Orders Received? Or Actions Taken?:  Standing Orders placed.  ?

## 2021-08-18 NOTE — ED Notes (Signed)
Admission nurse at [facility] ?

## 2021-08-18 NOTE — ED Provider Notes (Signed)
?[facility] ?Provider Note ? ? ?CSN: [id number] ?Arrival date & time: 08/18/21  1258 ? ?  ? ?History ? ?Chief Complaint  ?Patient presents with  ? Drug Overdose  ? ? ?Dawn Richard is a 51 y.o. female. ? ?Pt is a 51 yo female with pmhx significant for lbp, anxiety, migraines, seizures, ms, chronic pain, and fibromyalgia.  Pt had an argument last night with her significant other around 0130.  The SO left and did not come back until around noon today.  The SO found pt unresponsive on the ground with empty pill bottles.  Pt then called EMS.  The empty bottles were nabumetone, methocarbamol, gabapentin, and keppra.  Pt told EMS she took them to try to commit suicide.  Pt will awaken and tell me her name, but she is a poor historian. ? ? ?  ? ?Home Medications ?Prior to Admission medications   ?Medication Sig Start Date End Date Taking? Authorizing Provider  ?DULoxetine (CYMBALTA) 30 MG capsule Take 30 mg by mouth 2 (two) times daily.    [provider]  ?gabapentin (NEURONTIN) 600 MG tablet Take 600 mg by mouth 3 (three) times daily.  12/06/15   [provider]  ?ibuprofen (ADVIL,MOTRIN) 600 MG tablet Take 1 tablet (600 mg total) by mouth every 6 (six) hours as needed. 05/30/18   Ivery Quale, PA-C  ?levETIRAcetam (KEPPRA) 750 MG tablet Take 750 mg by mouth 2 (two) times daily.    [provider]  ?methocarbamol (ROBAXIN) 750 MG tablet  03/08/18   [provider]  ?nabumetone (RELAFEN) 750 MG tablet Take 750 mg by mouth 2 (two) times daily.    [provider]  ?omeprazole (PRILOSEC) 20 MG capsule Take 20 mg by mouth daily.    [provider]  ?ondansetron (ZOFRAN) 4 MG tablet Take 4 mg by mouth every 8 (eight) hours as needed for nausea or vomiting.    [provider]  ?oxyCODONE-acetaminophen (PERCOCET) 10-325 MG tablet Take 1 tablet by mouth every 6 (six) hours. 01/10/16   [provider]  ?tiZANidine (ZANAFLEX) 4 MG capsule Take 4  mg by mouth at bedtime.     [provider]  ?traMADol (ULTRAM) 50 MG tablet Take 1 tablet (50 mg total) by mouth every 6 (six) hours as needed. 06/03/18   Cindee Salt, MD  ?   ? ?Allergies    ?Bee venom and Doxycycline   ? ?Review of Systems   ?Review of Systems  ?Psychiatric/Behavioral:  Positive for suicidal ideas.   ?All other systems reviewed and are negative. ? ?Physical Exam ?Updated Vital Signs ?BP 98/79   Pulse 70   Resp 16   Ht 5\' 2"  (1.575 m)   Wt 74.8 kg   LMP 02/11/2012   SpO2 97%   BMI 30.18 kg/m?  ?Physical Exam ?Vitals and nursing note reviewed.  ?HENT:  ?   Head: Normocephalic and atraumatic.  ?   Right Ear: External ear normal.  ?   Left Ear: External ear normal.  ?   Nose: Nose normal.  ?   Mouth/Throat:  ?   Mouth: Mucous membranes are moist.  ?   Pharynx: Oropharynx is clear.  ?Eyes:  ?   Extraocular Movements: Extraocular movements intact.  ?   Conjunctiva/sclera: Conjunctivae normal.  ?   Pupils: Pupils are equal, round, and reactive to light.  ?Cardiovascular:  ?   Rate and Rhythm: Normal rate and regular rhythm.  ?   Pulses:  Normal pulses.  ?   Heart sounds: Normal heart sounds.  ?Pulmonary:  ?   Effort: Pulmonary effort is normal.  ?   Breath sounds: Normal breath sounds.  ?Abdominal:  ?   General: Abdomen is flat. Bowel sounds are normal.  ?   Palpations: Abdomen is soft.  ?Musculoskeletal:     ?   General: Normal range of motion.  ?   Cervical back: Normal range of motion and neck supple.  ?Skin: ?   General: Skin is warm.  ?   Capillary Refill: Capillary refill takes less than 2 seconds.  ?Neurological:  ?   General: No focal deficit present.  ?   Mental Status: She is lethargic.  ?   Comments: Pt knows her name and the year.  She is moving all 4 extremities.  ? ? ?ED Results / Procedures / Treatments   ?Labs ?(all labs ordered are listed, but only abnormal results are displayed) ?Labs Reviewed  ?COMPREHENSIVE METABOLIC PANEL - Abnormal; Notable for the following  components:  ?    Result Value  ? CO2 21 (*)   ? Glucose, Bld 166 (*)   ? Creatinine, Ser 1.08 (*)   ? Total Bilirubin 3.7 (*)   ? All other components within normal limits  ?SALICYLATE LEVEL - Abnormal; Notable for the following components:  ? Salicylate Lvl <7.0 (*)   ? All other components within normal limits  ?ACETAMINOPHEN LEVEL - Abnormal; Notable for the following components:  ? Acetaminophen (Tylenol), Serum <10 (*)   ? All other components within normal limits  ?CBC WITH DIFFERENTIAL/PLATELET - Abnormal; Notable for the following components:  ? Lymphs Abs 0.5 (*)   ? All other components within normal limits  ?CBG MONITORING, ED - Abnormal; Notable for the following components:  ? Glucose-Capillary 145 (*)   ? All other components within normal limits  ?ETHANOL  ?RAPID URINE DRUG SCREEN, HOSP PERFORMED  ?HIV ANTIBODY (ROUTINE TESTING W REFLEX)  ?POC URINE PREG, ED  ? ? ?EKG ?EKG Interpretation ? ?Date/Time:  Monday August 18 2021 13:37:39 EDT ?Ventricular Rate:  76 ?PR Interval:  128 ?QRS Duration: 103 ?QT Interval:  428 ?QTC Calculation: 482 ?R Axis:   60 ?Text Interpretation: Sinus rhythm Abnormal R-wave progression, early transition Minimal ST elevation, lateral leads No significant change since last tracing Confirmed by Jacalyn Lefevre [phone]) on 08/18/2021 2:18:22 PM ? ?Radiology ?DG Chest Port 1 View ? ?Result Date: 08/18/2021 ?CLINICAL DATA:  Possible drug overdose EXAM: PORTABLE CHEST 1 VIEW COMPARISON:  Previous studies including the examination of 08/13/2017 FINDINGS: Transverse diameter of heart is slightly increased. There is poor inspiration. Left hemidiaphragm is elevated. There is subtle increase in interstitial markings in the parahilar regions and lower lung fields. There is no focal consolidation. There is no pleural effusion or pneumothorax. IMPRESSION: Prominence of interstitial markings in the parahilar regions and lower lung fields Cundy suggest crowding of bronchovascular markings due to  poor inspiration or suggest interstitial pneumonia. There is no focal pulmonary consolidation. There is pleural effusion or pneumothorax. Electronically Signed   By: Ernie Avena M.D.   On: 08/18/2021 13:26   ? ?Procedures ?Procedures  ? ? ?Medications Ordered in ED ?Medications  ?sodium chloride 0.9 % bolus 1,000 mL (0 mLs Intravenous Stopped 08/18/21 1415)  ?  And  ?0.9 %  sodium chloride infusion ( Intravenous New Bag/Given 08/18/21 1338)  ?0.9 %  sodium chloride infusion (has no administration in time range)  ?sodium chloride 0.9 %  bolus 1,000 mL (1,000 mLs Intravenous New Bag/Given 08/18/21 1432)  ? ? ?ED Course/ Medical Decision Making/ A&P ?  ?                        ?Medical Decision Making ?Amount and/or Complexity of Data Reviewed ?Labs: ordered. ?Radiology: ordered. ? ?Risk ?Prescription drug management. ?Decision regarding hospitalization. ? ? ?This patient presents to the ED for concern of drug overdose, this involves an extensive number of treatment options, and is a complaint that carries with it a high risk of complications and morbidity.  The differential diagnosis includes drug overdose ? ? ?Co morbidities that complicate the patient evaluation ? ?bp, anxiety, migraines, seizures, ms, chronic pain, and fibromyalgia ? ? ?Additional history obtained: ? ?Additional history obtained from epic chart review ?External records from outside source obtained and reviewed including EMS report ? ? ?Lab Tests: ? ?I Ordered, and personally interpreted labs.  The pertinent results include:  cbc is nl; cmp is nl other than mild elevation in glucose aat 166 and Cr 1.08.  Acet and sal neg.  UA is pending. ? ? ?Imaging Studies ordered: ? ?I ordered imaging studies including CXR  ?I independently visualized and interpreted imaging which showed  ?  ?IMPRESSION:  ?Prominence of interstitial markings in the parahilar regions and  ?lower lung fields Gershman suggest crowding of bronchovascular markings  ?due to poor  inspiration or suggest interstitial pneumonia. There is  ?no focal pulmonary consolidation. There is pleural effusion or  ?pneumothorax.  ? ?I agree with the radiologist interpretation ? ? ?Cardiac Monitoring: ? ?The patien

## 2021-08-18 NOTE — Progress Notes (Signed)
Patient asking if she will get her kepra tonight , I explained to her, all meds are on hold the first 24 hours due  to overdose and empty kepra bottle observed by EMD. She states she did not take any kepra although bottle was empty. Contacted Dr Antionette Char. He states ok to wait till evaluated by Dr Natale Milch in AM . Poison control updated about patient condition ?

## 2021-08-18 NOTE — ED Notes (Signed)
Sitter and family arrive to [facility] ?

## 2021-08-18 NOTE — ED Notes (Signed)
Pt undressing. Jewelry and clothes bagged and labeled. Given to family. Sitter and family at [facility].  ?

## 2021-08-18 NOTE — ED Triage Notes (Addendum)
Patient via EMS due to possible drug over dose. Empty medication bottles on scene were nabumetone, methocarbamol, gabapentin,and levetiracetam. Patient was intentionally took medication as a suicide attempt. Patient is alert to voice.  ?

## 2021-08-18 NOTE — ED Notes (Signed)
Sleeping/ sedated. Arousable to voice, interactive, calm, NAD. ?

## 2021-08-18 NOTE — ED Notes (Signed)
Pt alert, NAD, calm, interactive with family x2 at [facility].  ?

## 2021-08-18 NOTE — ED Notes (Signed)
EDP at [facility] 

## 2021-08-19 ENCOUNTER — Encounter (HOSPITAL_COMMUNITY): Payer: Self-pay | Admitting: Internal Medicine

## 2021-08-19 DIAGNOSIS — T50902A Poisoning by unspecified drugs, medicaments and biological substances, intentional self-harm, initial encounter: Secondary | ICD-10-CM

## 2021-08-19 LAB — COMPREHENSIVE METABOLIC PANEL
ALT: 10 U/L (ref 0–44)
AST: 16 U/L (ref 15–41)
Albumin: 3.6 g/dL (ref 3.5–5.0)
Alkaline Phosphatase: 98 U/L (ref 38–126)
Anion gap: 6 (ref 5–15)
BUN: 14 mg/dL (ref 6–20)
CO2: 21 mmol/L — ABNORMAL LOW (ref 22–32)
Calcium: 8.5 mg/dL — ABNORMAL LOW (ref 8.9–10.3)
Chloride: 116 mmol/L — ABNORMAL HIGH (ref 98–111)
Creatinine, Ser: 0.8 mg/dL (ref 0.44–1.00)
GFR, Estimated: >60 mL/min (ref >60)
Glucose, Bld: 78 mg/dL (ref 70–99)
Potassium: 3.7 mmol/L (ref 3.5–5.1)
Sodium: 143 mmol/L (ref 135–145)
Total Bilirubin: 1.2 mg/dL (ref 0.3–1.2)
Total Protein: 6.4 g/dL — ABNORMAL LOW (ref 6.5–8.1)

## 2021-08-19 LAB — CBC
HCT: 34.7 % — ABNORMAL LOW (ref 36.0–46.0)
Hemoglobin: 11.2 g/dL — ABNORMAL LOW (ref 12.0–15.0)
MCH: 28.6 pg (ref 26.0–34.0)
MCHC: 32.3 g/dL (ref 30.0–36.0)
MCV: 88.5 fL (ref 80.0–100.0)
Platelets: 295 10*3/uL (ref 150–400)
RBC: 3.92 MIL/uL (ref 3.87–5.11)
RDW: 13.3 % (ref 11.5–15.5)
WBC: 6.9 10*3/uL (ref 4.0–10.5)
nRBC: 0 % (ref 0.0–0.2)

## 2021-08-19 LAB — RAPID URINE DRUG SCREEN, HOSP PERFORMED
Amphetamines: POSITIVE — AB
Barbiturates: NOT DETECTED
Benzodiazepines: NOT DETECTED
Cocaine: NOT DETECTED
Opiates: NOT DETECTED
Tetrahydrocannabinol: NOT DETECTED

## 2021-08-19 LAB — PREGNANCY, URINE: Preg Test, Ur: NEGATIVE

## 2021-08-19 LAB — HIV ANTIBODY (ROUTINE TESTING W REFLEX): HIV Screen 4th Generation wRfx: NONREACTIVE

## 2021-08-19 MED ORDER — LEVETIRACETAM 500 MG PO TABS
750.0000 mg | ORAL_TABLET | Freq: Two times a day (BID) | ORAL | Status: DC
Start: 2021-08-19 — End: 2021-08-19

## 2021-08-19 MED ORDER — ENSURE MAX PROTEIN PO LIQD
11.0000 [oz_av] | Freq: Every day | ORAL | Status: DC
  Administered 2021-08-19: 11 [oz_av] via ORAL

## 2021-08-19 MED ORDER — DULOXETINE HCL 40 MG PO CPEP: 40.0000 mg | ORAL_CAPSULE | Freq: Two times a day (BID) | ORAL | 1 refills | Status: AC

## 2021-08-19 MED ORDER — LEVETIRACETAM 500 MG PO TABS: 750.0000 mg | ORAL_TABLET | Freq: Two times a day (BID) | ORAL | Status: DC

## 2021-08-19 MED ORDER — DULOXETINE HCL 20 MG PO CPEP
40.0000 mg | ORAL_CAPSULE | Freq: Two times a day (BID) | ORAL | Status: DC
  Administered 2021-08-19: 40 mg via ORAL
  Filled 2021-08-19 (×4): qty 2

## 2021-08-19 NOTE — Progress Notes (Signed)
Patient admitted for intentional overdose. Attending has consulted TTS for psychiatric evaluation.  ? ? ? ?An Lannan D, LCSW  ?

## 2021-08-19 NOTE — Progress Notes (Addendum)
Initial Nutrition Assessment ? ?DOCUMENTATION CODES:  ? ?  ? ?INTERVENTION:  ?Ensure MAX (150 kcal, 30 gr protein) daily ? ?NUTRITION DIAGNOSIS:  ? ?Inadequate oral intake related to  (acute mental health crisis) as evidenced by  (Breakfast-0%). ? ? ?GOAL:  ?Patient will meet greater than or equal to 90% of their needs ? ? ?MONITOR:  ?PO intake, Labs, Weight trends ? ?REASON FOR ASSESSMENT:  ? ?Malnutrition Screening Tool ?  ? ?ASSESSMENT: Patient is a 51 yo female with hx of depression, vitamin D deficiency, MS, Fibromyalgia, chronic pain and GERD.  ? ?Presents to ED after intentional overdose of medications. Psychiatry consult pending. ? ?Patient is eating lunch (sandwich) during RD visit. Affirms good appetite. She was eating as usual up to acute admission. Nursing (sitter) reports pt did not eat breakfast.  Encouraged consistent meal intake.  ? ?Weight history reviewed. Range of 75-78 kg most of the past 5 + years.  ? ?Labs:  ? ?  Latest Ref Rng & Units 08/19/2021  ?  4:24 AM 08/18/2021  ?  1:03 PM 08/14/2017  ?  6:56 AM  ?BMP  ?Glucose 70 - 99 mg/dL 78   482   707    ?BUN 6 - 20 mg/dL 14   18   7     ?Creatinine 0.44 - 1.00 mg/dL   8.67   5.44    ?Sodium 135 - 145 mmol/L 143   140   141    ?Potassium 3.5 - 5.1 mmol/L 3.7   3.9   4.0    ?Chloride 98 - 111 mmol/L 116   108   110    ?CO2 22 - 32 mmol/L 21   21   23     ?Calcium 8.9 - 10.3 mg/dL 8.5   9.0   8.3    ?  ?NUTRITION - FOCUSED PHYSICAL EXAM: ?Deferred due to patient eating lunch.  ? ?Diet Order:   ?Diet Order   ? ?       ?  Diet regular Room service appropriate? Yes; Fluid consistency: Thin  Diet effective now       ?  ? ?  ?  ? ?  ? ? ?EDUCATION NEEDS:  ?Education needs have been addressed ? ?Skin:  Skin Assessment: Reviewed RN Assessment ? ?Last BM:  4/23 ? ?Height:  ? ?Ht Readings from Last 1 Encounters:  ?08/18/21 5\' 2"  (1.575 m)  ? ? ?Weight:  ? ?Wt Readings from Last 1 Encounters:  ?08/18/21 74.8 kg  ? ? ?Ideal Body Weight:   50 kg  ? ?BMI:  Body  mass index is 30.18 kg/m?. ? ?Estimated Nutritional Needs:  ? ?Kcal:  1700-1850 ? ?Protein:  75-85 gr ? ?Fluid:  >2 liters daily ? ?08/20/21 MS,RD,CSG,LDN ?Contact: AMION  ? ?

## 2021-08-19 NOTE — Progress Notes (Signed)
Feels the urge to void, however she states she cannot . Bladder scan revealed 349. In and out cathe performed ?

## 2021-08-19 NOTE — Discharge Summary (Signed)
Physician Discharge Summary  ?Dawn Richard [id number] DOB: 20-Jul-1970 DOA: 08/18/2021 ? ?PCP: Dawn Blitz, MD ? ?Admit date: 08/18/2021 ?Discharge date: 08/19/2021 ? ?Admitted From: Home ?Disposition: Home ? ?Recommendations for Outpatient Follow-up:  ?Follow up with PCP in 1-2 weeks ?Please obtain BMP/CBC in one week ?Please follow up with psychiatry as scheduled ? ?Home Health: None ?Equipment/Devices: None ? ?Discharge Condition: Stable ?CODE STATUS: Full ?Diet recommendation: As tolerated ? ?Brief/Interim Summary: ?70 F Dawn Richard is a 51 y.o. female with medical history significant of GERD, MS, Migraines, seizure disorder, depression/anxiety, fibromyalgia, lumbago and vitamin D deficiency who presents to the ED from home today after intentional overdose of multiple medications including, but likely not limited to nabumetone methocarbamol gabapentin and Keppra.  This was done after a fight with her husband and was done in a suicidal attempt/suicidal ideation response stating "I just cannot take it anymore."  Toxicology was contacted by ED who recommended supportive care and IV fluids and telemetry for the next 24 to 48 hours.  Hospitalist called for admission, psychiatry consulted for further evaluation and work-up in the setting of suicidal ideation and attempt. ? ?Patient's labs EKG and vitals otherwise unremarkable after 24 hours of monitoring.  Evaluated by psychiatric team here, appreciate insight and recommendations.  After further discussion with psychiatric team and patient she is otherwise agreeable and stable for discharge home per our discussion, psychiatry has adjusted her home medications as below, multiple medications were discontinued at discharge.  Recommend close follow-up with PCP for reevaluation on patient's home med list to ensure all her chronic medications are appropriate given concern for polypharmacy as above. ? ?Discharge Diagnoses:  ?Principal Problem: ?  Drug overdose, intentional,  initial encounter ([place]) ?Active Problems: ?  Seizure ([place]) ?  Gastroenteritis ?  Anxiety ?  Back pain ?  Migraine ?  Fibromyalgia affecting multiple sites ?  MS (multiple sclerosis) ([place]) ?  Normocytic anemia ?  Chronic pain disorder ?  Lumbar degenerative disc disease ? ? ? ?Discharge Instructions ? ?Discharge Instructions   ? ? Discharge patient   Complete by: As directed ?  ? Discharge disposition: 01-Home or Self Care  ? Discharge patient date: 08/19/2021  ? ?  ? ?Allergies as of 08/19/2021   ? ?   Reactions  ? Bee Venom   ? swelling  ? Doxycycline   ? "sick"  ? ?  ? ?  ?Medication List  ?  ? ?STOP taking these medications   ? ?ibuprofen 600 MG tablet ?Commonly known as: ADVIL ?  ?methocarbamol 750 MG tablet ?Commonly known as: ROBAXIN ?  ?oxyCODONE-acetaminophen 10-325 MG tablet ?Commonly known as: PERCOCET ?  ?tiZANidine 4 MG capsule ?Commonly known as: ZANAFLEX ?  ? ?  ? ?TAKE these medications   ? ?DULoxetine HCl 40 MG Cpep ?Take 40 mg by mouth 2 (two) times daily. ?What changed:  ?medication strength ?how much to take ?  ?gabapentin 600 MG tablet ?Commonly known as: NEURONTIN ?Take 600 mg by mouth 3 (three) times daily. ?  ?levETIRAcetam 750 MG tablet ?Commonly known as: KEPPRA ?Take 750 mg by mouth 2 (two) times daily. ?  ?nabumetone 750 MG tablet ?Commonly known as: RELAFEN ?Take 750 mg by mouth 2 (two) times daily. ?  ?omeprazole 20 MG capsule ?Commonly known as: PRILOSEC ?Take 20 mg by mouth daily. ?  ?ondansetron 4 MG tablet ?Commonly known as: ZOFRAN ?Take 4 mg by mouth every 8 (eight) hours as needed for nausea or vomiting. ?  ?traMADol  50 MG tablet ?Commonly known as: Ultram ?Take 1 tablet (50 mg total) by mouth every 6 (six) hours as needed. ?  ? ?  ? ? ?Allergies  ?Allergen Reactions  ? Bee Venom   ?  swelling  ? Doxycycline   ?  "sick"  ? ? ?Consultations: ?Psychiatry ? ?Procedures/Studies: ?DG Chest Port 1 View ? ?Result Date: 08/18/2021 ?CLINICAL DATA:  Possible drug overdose EXAM: PORTABLE CHEST  1 VIEW COMPARISON:  Previous studies including the examination of 08/13/2017 FINDINGS: Transverse diameter of heart is slightly increased. There is poor inspiration. Left hemidiaphragm is elevated. There is subtle increase in interstitial markings in the parahilar regions and lower lung fields. There is no focal consolidation. There is no pleural effusion or pneumothorax. IMPRESSION: Prominence of interstitial markings in the parahilar regions and lower lung fields Dawn Richard suggest crowding of bronchovascular markings due to poor inspiration or suggest interstitial pneumonia. There is no focal pulmonary consolidation. There is pleural effusion or pneumothorax. Electronically Signed   By: Dawn Richard M.D.   On: 08/18/2021 13:26   ? ? ?Subjective: No acute issues or events overnight, denies nausea vomiting diarrhea constipation any fevers chills or chest pain ? ? ?Discharge Exam: ?Vitals:  ? 08/19/21 1300 08/19/21 1413  ?BP: 113/67 136/84  ?Pulse: 88 80  ?Resp: (!) 9   ?Temp:  98.2 ?F (36.8 ?C)  ?SpO2: 97% 100%  ? ?Vitals:  ? 08/19/21 1100 08/19/21 1200 08/19/21 1300 08/19/21 1413  ?BP: 116/72 130/71 113/67 136/84  ?Pulse: 80 85 88 80  ?Resp: 10 13 (!) 9   ?Temp: 97.6 ?F (36.4 ?C)   98.2 ?F (36.8 ?C)  ?TempSrc: Oral   Oral  ?SpO2: 97% 99% 97% 100%  ?Weight:      ?Height:      ? ? ?General: Pt is alert, awake, not in acute distress ?Cardiovascular: RRR, S1/S2 +, no rubs, no gallops ?Respiratory: CTA bilaterally, no wheezing, no rhonchi ?Abdominal: Soft, NT, ND, bowel sounds + ?Extremities: no edema, no cyanosis ? ? ? ?The results of significant diagnostics from this hospitalization (including imaging, microbiology, ancillary and laboratory) are listed below for reference.   ? ? ?Microbiology: ?Recent Results (from the past 240 hour(s))  ?MRSA Next Gen by PCR, Nasal     Status: Abnormal  ? Collection Time: 08/18/21  4:45 PM  ? Specimen: Nasal Mucosa; Nasal Swab  ?Result Value Ref Range Status  ? MRSA by PCR Next Gen  DETECTED (A) NOT DETECTED Final  ?  Comment: RESULT CALLED TO, READ BACK BY AND VERIFIED WITH: ?Dawn FOLEY @ 1838 ON 08/18/21 C VARNER ?(NOTE) ?The GeneXpert MRSA Assay (FDA approved for NASAL specimens only), ?is one component of a comprehensive MRSA colonization surveillance ?program. It is not intended to diagnose MRSA infection nor to guide ?or monitor treatment for MRSA infections. ?Test performance is not FDA approved in patients less than 2 years ?old. ?Performed at [facility], [address] [place], [place] [address] ?  ?  ? ?Labs: ?BNP (last 3 results) ?No results for input(s): BNP in the last 8760 hours. ?Basic Metabolic Panel: ?Recent Labs  ?Lab 08/18/21 ?1303 08/19/21 ?0424  ?NA 140 143  ?K 3.9 3.7  ?CL 108 116*  ?CO2 21* 21*  ?GLUCOSE 166* 78  ?BUN 18 14  ?CREATININE 1.08* 0.80  ?CALCIUM 9.0 8.5*  ? ?Liver Function Tests: ?Recent Labs  ?Lab 08/18/21 ?1303 08/19/21 ?0424  ?AST 20 16  ?ALT 9 10  ?ALKPHOS 112 98  ?BILITOT  3.7* 1.2  ?PROT 7.2 6.4*  ?ALBUMIN 4.2 3.6  ? ?No results for input(s): LIPASE, AMYLASE in the last 168 hours. ?No results for input(s): AMMONIA in the last 168 hours. ?CBC: ?Recent Labs  ?Lab 08/18/21 ?1303 08/19/21 ?0424  ?WBC 5.5 6.9  ?NEUTROABS 4.9  --   ?HGB 12.8 11.2*  ?HCT 39.1 34.7*  ?MCV 86.5 88.5  ?PLT 281 295  ? ?Cardiac Enzymes: ?No results for input(s): CKTOTAL, CKMB, CKMBINDEX, TROPONINI in the last 168 hours. ?BNP: ?Invalid input(s): POCBNP ?CBG: ?Recent Labs  ?Lab 08/18/21 ?1300  ?GLUCAP 145*  ? ?D-Dimer ?No results for input(s): DDIMER in the last 72 hours. ?Hgb A1c ?No results for input(s): HGBA1C in the last 72 hours. ?Lipid Profile ?No results for input(s): CHOL, HDL, LDLCALC, TRIG, CHOLHDL, LDLDIRECT in the last 72 hours. ?Thyroid function studies ?No results for input(s): TSH, T4TOTAL, T3FREE, THYROIDAB in the last 72 hours. ? ?Invalid input(s): FREET3 ?Anemia work up ?No results for input(s): VITAMINB12, FOLATE, FERRITIN, TIBC, IRON, RETICCTPCT in the last 72  hours. ?Urinalysis ?   ?Component Value Date/Time  ? [place] YELLOW 08/13/2017 1904  ? APPEARANCEUR HAZY (A) 08/13/2017 1904  ? LABSPEC 1.013 08/13/2017 1904  ? PHURINE 6.0 08/13/2017 1904  ? GLUCOSEU NE
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
## 2021-08-19 NOTE — Progress Notes (Deleted)
?PROGRESS NOTE ? ? ? ?Dawn ARONOFF  Richard:[id number] DOB: [birth year] DOA: 08/18/2021 ?PCP: Kirstie Peri, MD ? ? ?Brief Narrative:  ?Dawn Richard is a 51 y.o. female with medical history significant of GERD, MS, Migraines, seizure disorder, depression/anxiety, fibromyalgia, lumbago and vitamin D deficiency who presents to the ED from home today after intentional overdose of multiple medications including, but likely not limited to nabumetone methocarbamol gabapentin and Keppra.  This was done after a fight with her husband and was done in a suicidal attempt/suicidal ideation response stating "I just cannot take it anymore."  Toxicology was contacted by ED who recommended supportive care and IV fluids and telemetry for the next 24 to 48 hours.  Hospitalist called for admission, psychiatry consulted for further evaluation and work-up in the setting of suicidal ideation and attempt. ? ?Assessment & Plan: ?  ?Principal Problem: ?  Drug overdose, intentional, initial encounter (HCC) ?Active Problems: ?  Seizure (HCC) ?  Gastroenteritis ?  Anxiety ?  Back pain ?  Migraine ?  Fibromyalgia affecting multiple sites ?  MS (multiple sclerosis) (HCC) ?  Normocytic anemia ?  Chronic pain disorder ?  Lumbar degenerative disc disease ? ?Intentional overdose, suicidal ideation and attempt, with polypharmacy, POA ?Depression/anxiety ?-Patient denies any history of suicidal ideation or attempt. ?-This was done in retaliation to a fight had with her husband. ?-Continue supportive care including Zofran, Phenergan, IV fluids ?-Continue telemetry for 24 hours ?-Psychiatry consulted, appreciate insight and recommendations as well as assistance with placement if necessary. ?  ?Intractable nausea vomiting secondary to above ?-Improving with IV fluids and Zofran/Phenergan -follow QT -currently within normal limits ?  ?Seizure disorder ?-Resume keppra tonight ?  ?Fibromyalgia, MS, chronic pain disorder, lumbago, chronic ?-We will continue to hold  any CNS depressant or narcotic medications in the interim ?-Patient and husband understand ?-Ibuprofen prescribed in the meantime for coverage ?  ?DVT prophylaxis: Early ambulation, low risk ?Code Status: Full ?Family Communication: Husband at bedside ? ?Status is: Inpatient ? ?Dispo: The patient is from: Home ?             Anticipated d/c is to: To be determined pending psych evaluation ?             Anticipated d/c date is: 24 to 48 hours ?             Patient currently not medically stable for discharge ? ?Consultants:  ?Psych ? ?Procedures:  ?None ? ?Antimicrobials:  ?None ? ?Subjective: ?No acute issues or events overnight ? ?Objective: ?Vitals:  ? 08/19/21 0100 08/19/21 0200 08/19/21 0300 08/19/21 0400  ?BP: 117/64 (!) 106/51 115/70 113/66  ?Pulse: (!) 101 (!) 109 (!) 103 (!) 105  ?Resp: (!) 23 12 (!) 23 (!) 8  ?Temp:   99.3 ?F (37.4 ?C)   ?TempSrc:   Rectal   ?SpO2: 100% 98% 98% 95%  ?Weight:      ?Height:      ? ? ?Intake/Output Summary (Last 24 hours) at 08/19/2021 0735 ?Last data filed at 08/19/2021 0300 ?Gross per 24 hour  ?Intake 3139.36 ml  ?Output 900 ml  ?Net 2239.36 ml  ? ?Filed Weights  ? 08/18/21 1306  ?Weight: 74.8 kg  ? ? ?Examination: ? ?General exam: Appears calm and comfortable  ?Respiratory system: Clear to auscultation. Respiratory effort normal. ?Cardiovascular system: S1 & S2 heard, RRR. No JVD, murmurs, rubs, gallops or clicks. No pedal edema. ?Gastrointestinal system: Abdomen is nondistended, soft and nontender. No organomegaly or  masses felt. Normal bowel sounds heard. ?Central nervous system: Alert and oriented. No focal neurological deficits. ?Extremities: Symmetric 5 x 5 power. ?Skin: No rashes, lesions or ulcers ?Psychiatry: Judgement and insight appear normal. Mood & affect appropriate.  ? ? ? ?Data Reviewed: I have personally reviewed following labs and imaging studies ? ?CBC: ?Recent Labs  ?Lab 08/18/21 ?1303 08/19/21 ?0424  ?WBC 5.5 6.9  ?NEUTROABS 4.9  --   ?HGB 12.8 11.2*  ?HCT  39.1 34.7*  ?MCV 86.5 88.5  ?PLT 281 295  ? ?Basic Metabolic Panel: ?Recent Labs  ?Lab 08/18/21 ?1303 08/19/21 ?0424  ?NA 140 143  ?K 3.9 3.7  ?CL 108 116*  ?CO2 21* 21*  ?GLUCOSE 166* 78  ?BUN 18 14  ?CREATININE 1.08* 0.80  ?CALCIUM 9.0 8.5*  ? ?GFR: ?Estimated Creatinine Clearance: 81.5 mL/min (by C-G formula based on SCr of 0.8 mg/dL). ?Liver Function Tests: ?Recent Labs  ?Lab 08/18/21 ?1303 08/19/21 ?0424  ?AST 20 16  ?ALT 9 10  ?ALKPHOS 112 98  ?BILITOT 3.7* 1.2  ?PROT 7.2 6.4*  ?ALBUMIN 4.2 3.6  ? ?No results for input(s): LIPASE, AMYLASE in the last 168 hours. ?No results for input(s): AMMONIA in the last 168 hours. ?Coagulation Profile: ?No results for input(s): INR, PROTIME in the last 168 hours. ?Cardiac Enzymes: ?No results for input(s): CKTOTAL, CKMB, CKMBINDEX, TROPONINI in the last 168 hours. ?BNP (last 3 results) ?No results for input(s): PROBNP in the last 8760 hours. ?HbA1C: ?No results for input(s): HGBA1C in the last 72 hours. ?CBG: ?Recent Labs  ?Lab 08/18/21 ?1300  ?GLUCAP 145*  ? ?Lipid Profile: ?No results for input(s): CHOL, HDL, LDLCALC, TRIG, CHOLHDL, LDLDIRECT in the last 72 hours. ?Thyroid Function Tests: ?No results for input(s): TSH, T4TOTAL, FREET4, T3FREE, THYROIDAB in the last 72 hours. ?Anemia Panel: ?No results for input(s): VITAMINB12, FOLATE, FERRITIN, TIBC, IRON, RETICCTPCT in the last 72 hours. ?Sepsis Labs: ?No results for input(s): PROCALCITON, LATICACIDVEN in the last 168 hours. ? ?Recent Results (from the past 240 hour(s))  ?MRSA Next Gen by PCR, Nasal     Status: Abnormal  ? Collection Time: 08/18/21  4:45 PM  ? Specimen: Nasal Mucosa; Nasal Swab  ?Result Value Ref Range Status  ? MRSA by PCR Next Gen DETECTED (A) NOT DETECTED Final  ?  Comment: RESULT CALLED TO, READ BACK BY AND VERIFIED WITH: ?BRITTANY FOLEY @ 1838 ON 08/18/21 C VARNER ?(NOTE) ?The GeneXpert MRSA Assay (FDA approved for NASAL specimens only), ?is one component of a comprehensive MRSA colonization  surveillance ?program. It is not intended to diagnose MRSA infection nor to guide ?or monitor treatment for MRSA infections. ?Test performance is not FDA approved in patients less than 2 years ?old. ?Performed at [facility], [address] [place], [place] [address] ?  ?  ? ? ? ? ? ?Radiology Studies: ?DG Chest Port 1 View ? ?Result Date: 08/18/2021 ?CLINICAL DATA:  Possible drug overdose EXAM: PORTABLE CHEST 1 VIEW COMPARISON:  Previous studies including the examination of 08/13/2017 FINDINGS: Transverse diameter of heart is slightly increased. There is poor inspiration. Left hemidiaphragm is elevated. There is subtle increase in interstitial markings in the parahilar regions and lower lung fields. There is no focal consolidation. There is no pleural effusion or pneumothorax. IMPRESSION: Prominence of interstitial markings in the parahilar regions and lower lung fields Lempke suggest crowding of bronchovascular markings due to poor inspiration or suggest interstitial pneumonia. There is no focal pulmonary consolidation. There is pleural effusion or pneumothorax. Electronically  Signed   By: Ernie Avena M.D.   On: 08/18/2021 13:26   ? ?Scheduled Meds: ? Chlorhexidine Gluconate Cloth  6 each Topical Q0600  ? mupirocin ointment  1 application. Nasal BID  ? ?Continuous Infusions: ? sodium chloride 75 mL/hr at 08/18/21 2106  ? ? ? LOS: 1 day  ? ?Time spent: ? ?Azucena Fallen, DO ?[facility] ? ?If 7PM-7AM, please contact night-coverage ?[email or web address] ? ?08/19/2021, 7:35 AM   ? ? ? ?

## 2021-08-19 NOTE — Progress Notes (Signed)
Low urinary output reported to [facility] on call provider dr Antionette Char ?

## 2023-11-22 IMAGING — DX DG CHEST 1V PORT
1 series · 1 of 1 positions shown · non-contrast
Comparison: Previous studies including the examination of
08/13/2017

CLINICAL DATA: Possible drug overdose

EXAM:
PORTABLE CHEST 1 VIEW

[chest ap]
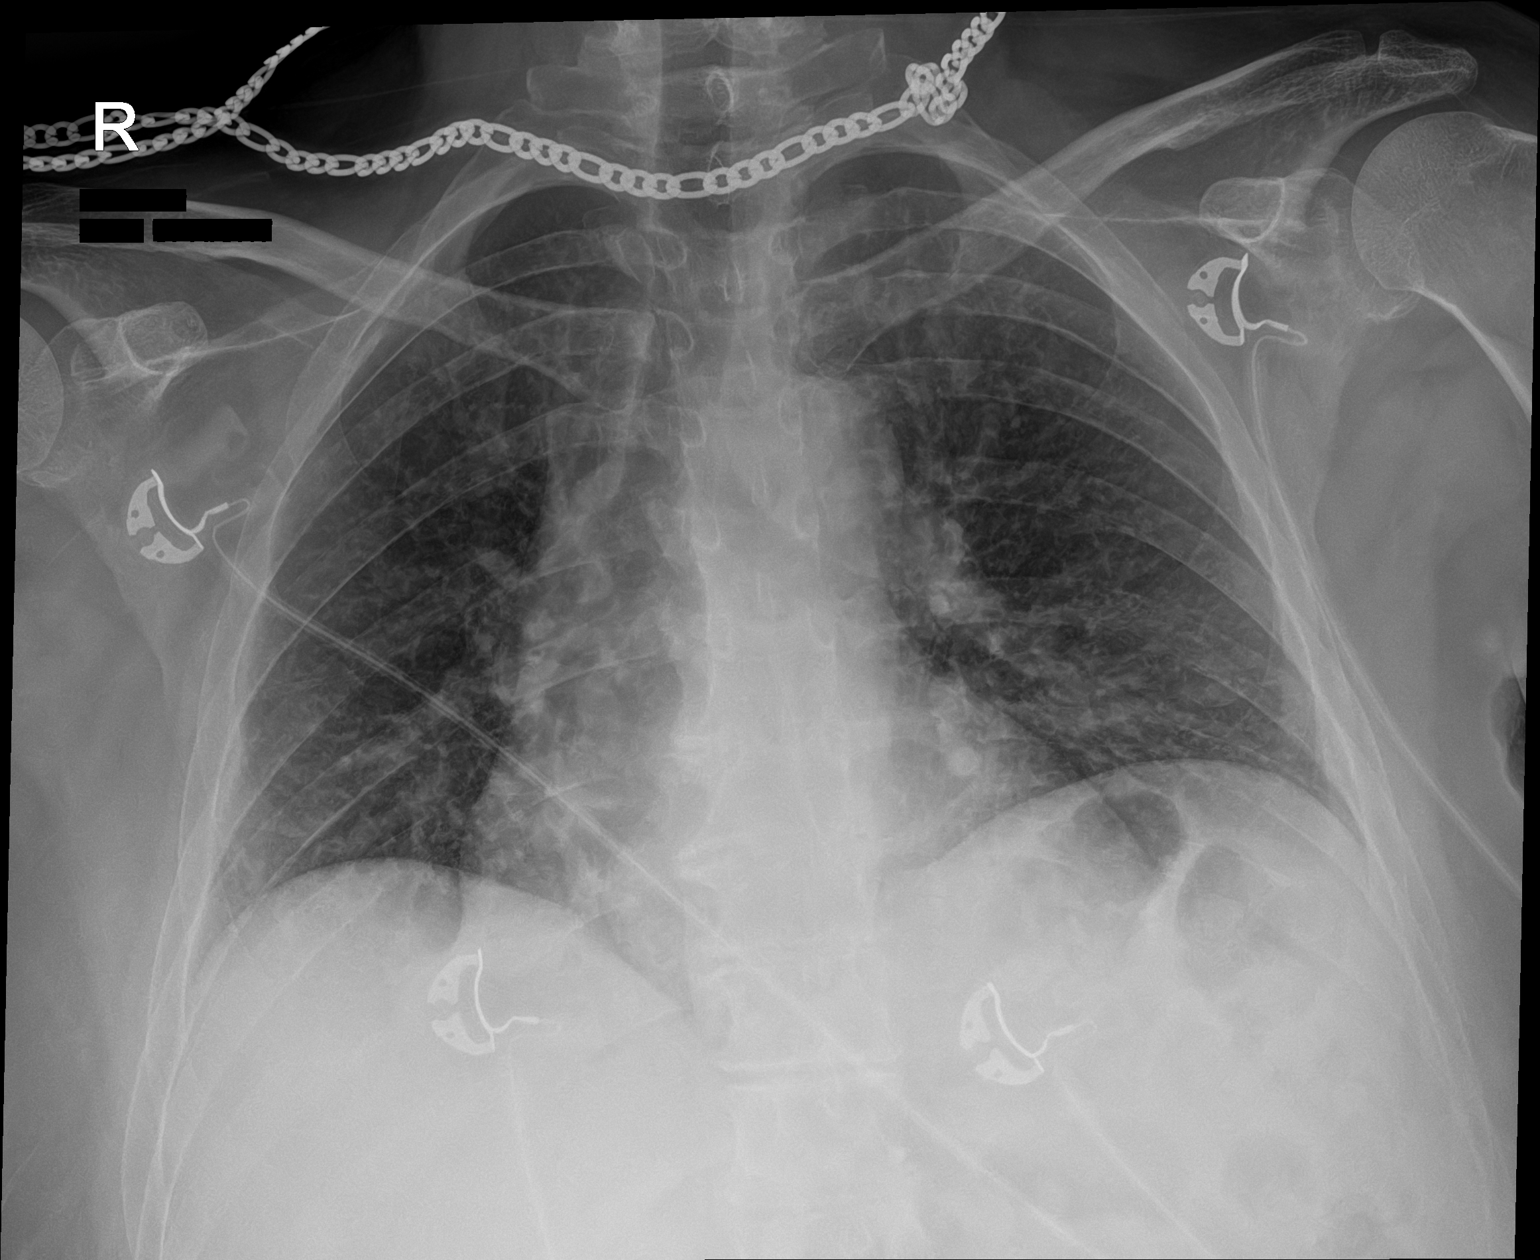

[1 of 1 positions shown; findings below may reference images not displayed]

FINDINGS: Transverse diameter of heart is slightly increased. There is poor
inspiration. Left hemidiaphragm is elevated. There is subtle
increase in interstitial markings in the parahilar regions and lower
lung fields. There is no focal consolidation. There is no pleural
effusion or pneumothorax.
IMPRESSION: Prominence of interstitial markings in the parahilar regions and
lower lung fields may suggest crowding of bronchovascular markings
due to poor inspiration or suggest interstitial pneumonia. There is
no focal pulmonary consolidation. There is pleural effusion or
pneumothorax.

## 2024-02-16 ENCOUNTER — Other Ambulatory Visit: Payer: Self-pay

## 2024-02-16 ENCOUNTER — Observation Stay (HOSPITAL_COMMUNITY)
Admission: EM | Admit: 2024-02-16 | Discharge: 2024-02-17 | Disposition: A | Discharge status: Home / Self Care | Attending: General Surgery | Admitting: General Surgery

## 2024-02-16 ENCOUNTER — Encounter (HOSPITAL_COMMUNITY): Payer: Self-pay

## 2024-02-16 ENCOUNTER — Emergency Department (HOSPITAL_COMMUNITY)

## 2024-02-16 DIAGNOSIS — N390 Urinary tract infection, site not specified: Secondary | ICD-10-CM

## 2024-02-16 DIAGNOSIS — K8020 Calculus of gallbladder without cholecystitis without obstruction: Principal | ICD-10-CM | POA: Diagnosis present

## 2024-02-16 DIAGNOSIS — R1011 Right upper quadrant pain: Secondary | ICD-10-CM | POA: Diagnosis present

## 2024-02-16 DIAGNOSIS — I10 Essential (primary) hypertension: Secondary | ICD-10-CM | POA: Insufficient documentation

## 2024-02-16 DIAGNOSIS — K8010 Calculus of gallbladder with chronic cholecystitis without obstruction: Principal | ICD-10-CM | POA: Insufficient documentation

## 2024-02-16 DIAGNOSIS — R109 Unspecified abdominal pain: Secondary | ICD-10-CM

## 2024-02-16 HISTORY — DX: Gastro-esophageal reflux disease without esophagitis: K21.9

## 2024-02-16 LAB — URINALYSIS, ROUTINE W REFLEX MICROSCOPIC
Bacteria, UA: NONE SEEN
Glucose, UA: NEGATIVE mg/dL
Hgb urine dipstick: NEGATIVE
Ketones, ur: NEGATIVE mg/dL
Nitrite: NEGATIVE
Protein, ur: NEGATIVE mg/dL
Specific Gravity, Urine: 1.024 (ref 1.005–1.030)
pH: 5 (ref 5.0–8.0)

## 2024-02-16 LAB — COMPREHENSIVE METABOLIC PANEL WITH GFR
ALT: 8 U/L (ref 0–44)
AST: 17 U/L (ref 15–41)
Albumin: 4.2 g/dL (ref 3.5–5.0)
Alkaline Phosphatase: 99 U/L (ref 38–126)
Anion gap: 9 (ref 5–15)
BUN: 11 mg/dL (ref 6–20)
CO2: 27 mmol/L (ref 22–32)
Calcium: 8.8 mg/dL — ABNORMAL LOW (ref 8.9–10.3)
Chloride: 105 mmol/L (ref 98–111)
Creatinine, Ser: 0.92 mg/dL (ref 0.44–1.00)
GFR, Estimated: >60 mL/min (ref >60)
Glucose, Bld: 102 mg/dL — ABNORMAL HIGH (ref 70–99)
Potassium: 4.4 mmol/L (ref 3.5–5.1)
Sodium: 141 mmol/L (ref 135–145)
Total Bilirubin: 0.2 mg/dL (ref 0.0–1.2)
Total Protein: 6.6 g/dL (ref 6.5–8.1)

## 2024-02-16 LAB — CBC WITH DIFFERENTIAL/PLATELET
Abs Immature Granulocytes: 0.01 K/uL (ref 0.00–0.07)
Basophils Absolute: 0 K/uL (ref 0.0–0.1)
Basophils Relative: 1 %
Eosinophils Absolute: 0 K/uL (ref 0.0–0.5)
Eosinophils Relative: 0 %
HCT: 37.3 % (ref 36.0–46.0)
Hemoglobin: 12.1 g/dL (ref 12.0–15.0)
Immature Granulocytes: 0 %
Lymphocytes Relative: 27 %
Lymphs Abs: 1.2 K/uL (ref 0.7–4.0)
MCH: 29.4 pg (ref 26.0–34.0)
MCHC: 32.4 g/dL (ref 30.0–36.0)
MCV: 90.5 fL (ref 80.0–100.0)
Monocytes Absolute: 0.4 K/uL (ref 0.1–1.0)
Monocytes Relative: 9 %
Neutro Abs: 2.8 K/uL (ref 1.7–7.7)
Neutrophils Relative %: 63 %
Platelets: 269 K/uL (ref 150–400)
RBC: 4.12 MIL/uL (ref 3.87–5.11)
RDW: 12.3 % (ref 11.5–15.5)
WBC: 4.5 K/uL (ref 4.0–10.5)
nRBC: 0 % (ref 0.0–0.2)

## 2024-02-16 LAB — MRSA NEXT GEN BY PCR, NASAL: MRSA by PCR Next Gen: NOT DETECTED

## 2024-02-16 LAB — LIPASE, BLOOD: Lipase: 21 U/L (ref 11–51)

## 2024-02-16 MED ORDER — OXYCODONE HCL 5 MG PO TABS
5.0000 mg | ORAL_TABLET | ORAL | Status: DC | PRN
  Administered 2024-02-16: 5 mg via ORAL
  Filled 2024-02-16: qty 1

## 2024-02-16 MED ORDER — METRONIDAZOLE 500 MG/100ML IV SOLN: 500.0000 mg | Freq: Once | INTRAVENOUS | Status: DC

## 2024-02-16 MED ORDER — SODIUM CHLORIDE 0.9 % IV BOLUS
1000.0000 mL | Freq: Once | INTRAVENOUS | Status: AC
  Administered 2024-02-16: 1000 mL via INTRAVENOUS

## 2024-02-16 MED ORDER — INDOCYANINE GREEN 25 MG IV SOLR
2.5000 mg | Freq: Once | INTRAVENOUS | Status: DC
  Filled 2024-02-16: qty 10

## 2024-02-16 MED ORDER — LEVETIRACETAM 500 MG PO TABS
750.0000 mg | ORAL_TABLET | Freq: Two times a day (BID) | ORAL | Status: DC
Start: 2024-02-16 — End: 2024-02-17
  Administered 2024-02-16: 750 mg via ORAL
  Filled 2024-02-16: qty 1

## 2024-02-16 MED ORDER — INDOCYANINE GREEN 25 MG IV SOLR
2.5000 mg | INTRAVENOUS | Status: AC
  Filled 2024-02-16: qty 10

## 2024-02-16 MED ORDER — MELATONIN 3 MG PO TABS
3.0000 mg | ORAL_TABLET | Freq: Every day | ORAL | Status: DC
  Administered 2024-02-16: 3 mg via ORAL
  Filled 2024-02-16: qty 1

## 2024-02-16 MED ORDER — MORPHINE SULFATE (PF) 4 MG/ML IV SOLN
4.0000 mg | Freq: Once | INTRAVENOUS | Status: AC
  Administered 2024-02-16: 4 mg via INTRAVENOUS
  Filled 2024-02-16: qty 1

## 2024-02-16 MED ORDER — HYDROMORPHONE HCL 1 MG/ML IJ SOLN
1.0000 mg | INTRAMUSCULAR | Status: DC | PRN
  Administered 2024-02-16 – 2024-02-17 (×3): 1 mg via INTRAVENOUS
  Filled 2024-02-16 (×3): qty 1

## 2024-02-16 MED ORDER — HYDROMORPHONE HCL 1 MG/ML IJ SOLN
1.0000 mg | Freq: Once | INTRAMUSCULAR | Status: AC
  Administered 2024-02-16: 1 mg via INTRAVENOUS
  Filled 2024-02-16: qty 1

## 2024-02-16 MED ORDER — SIMETHICONE 80 MG PO CHEW: 40.0000 mg | CHEWABLE_TABLET | Freq: Four times a day (QID) | ORAL | Status: DC | PRN

## 2024-02-16 MED ORDER — ACETAMINOPHEN 650 MG RE SUPP
650.0000 mg | Freq: Four times a day (QID) | RECTAL | Status: DC | PRN
Start: 2024-02-16 — End: 2024-02-17

## 2024-02-16 MED ORDER — KETOROLAC TROMETHAMINE 15 MG/ML IJ SOLN
15.0000 mg | Freq: Once | INTRAMUSCULAR | Status: AC
  Administered 2024-02-16: 15 mg via INTRAVENOUS
  Filled 2024-02-16: qty 1

## 2024-02-16 MED ORDER — ONDANSETRON 4 MG PO TBDP: 4.0000 mg | ORAL_TABLET | Freq: Four times a day (QID) | ORAL | Status: DC | PRN

## 2024-02-16 MED ORDER — IOHEXOL 300 MG/ML  SOLN
100.0000 mL | Freq: Once | INTRAMUSCULAR | Status: AC | PRN
  Administered 2024-02-16: 100 mL via INTRAVENOUS

## 2024-02-16 MED ORDER — SODIUM CHLORIDE 0.9 % IV SOLN
1.0000 g | Freq: Once | INTRAVENOUS | Status: AC
  Administered 2024-02-16: 1 g via INTRAVENOUS
  Filled 2024-02-16: qty 10

## 2024-02-16 MED ORDER — SODIUM CHLORIDE 0.9 % IV SOLN: INTRAVENOUS | Status: DC

## 2024-02-16 MED ORDER — ONDANSETRON HCL 4 MG/2ML IJ SOLN
4.0000 mg | Freq: Once | INTRAMUSCULAR | Status: AC
Start: 2024-02-16 — End: 2024-02-16
  Administered 2024-02-16: 4 mg via INTRAVENOUS
  Filled 2024-02-16: qty 2

## 2024-02-16 MED ORDER — ENOXAPARIN SODIUM 40 MG/0.4ML IJ SOSY
40.0000 mg | PREFILLED_SYRINGE | INTRAMUSCULAR | Status: DC
  Filled 2024-02-16: qty 0.4

## 2024-02-16 MED ORDER — CEFAZOLIN SODIUM-DEXTROSE 2-4 GM/100ML-% IV SOLN
2.0000 g | Freq: Once | INTRAVENOUS | Status: AC
  Administered 2024-02-17: 2 g via INTRAVENOUS

## 2024-02-16 MED ORDER — ONDANSETRON HCL 4 MG/2ML IJ SOLN
4.0000 mg | Freq: Four times a day (QID) | INTRAMUSCULAR | Status: DC | PRN
  Administered 2024-02-17: 4 mg via INTRAVENOUS
  Filled 2024-02-16: qty 2

## 2024-02-16 MED ORDER — ACETAMINOPHEN 325 MG PO TABS: 650.0000 mg | ORAL_TABLET | Freq: Four times a day (QID) | ORAL | Status: DC | PRN

## 2024-02-16 NOTE — ED Triage Notes (Signed)
 Pt arrived via POV c/o right side abdominal pain that began apprx 4 days ago. Pt denies injury. Pt denies hematuria.

## 2024-02-16 NOTE — ED Provider Notes (Signed)
 [place] EMERGENCY DEPARTMENT AT [facility] Provider Note   CSN: [id number] Arrival date & time: 02/16/24  1056     Patient presents with: Abdominal Pain   Dawn Richard is a 53 y.o. female.   Patient is a 53 year old female who presents to the emergency department the chief complaint of right side abdominal pain which has been ongoing for approximate the past 4 days.  It has been associated with nausea without vomiting.  She denies any constipation or diarrhea.  She denies any dysuria or hematuria.  She denies any history of previous abdominal surgeries.  She denies any dizziness, lightheadedness or syncope.  There is been no fever or chills.  She denies any recent falls or blunt abdominal wall trauma.   Abdominal Pain      Prior to Admission medications   Medication Sig Start Date End Date Taking? Authorizing Provider  DULoxetine  40 MG CPEP Take 40 mg by mouth 2 (two) times daily. 08/19/21   Lue Elsie BROCKS, MD  gabapentin  (NEURONTIN ) 600 MG tablet Take 600 mg by mouth 3 (three) times daily.  12/06/15   [provider]  levETIRAcetam  (KEPPRA ) 750 MG tablet Take 750 mg by mouth 2 (two) times daily.    [provider]  nabumetone  (RELAFEN ) 750 MG tablet Take 750 mg by mouth 2 (two) times daily.    [provider]  omeprazole (PRILOSEC) 20 MG capsule Take 20 mg by mouth daily.    [provider]  ondansetron  (ZOFRAN ) 4 MG tablet Take 4 mg by mouth every 8 (eight) hours as needed for nausea or vomiting.    [provider]  traMADol  (ULTRAM ) 50 MG tablet Take 1 tablet (50 mg total) by mouth every 6 (six) hours as needed. 06/03/18   Murrell Kuba, MD    Allergies: Bee venom and Doxycycline     Review of Systems  Gastrointestinal:  Positive for abdominal pain.  All other systems reviewed and are negative.   Updated Vital Signs BP (!) 126/92 (BP Location: Right Arm)   Pulse 67   Temp 98 F (36.7 C) (Oral)   Resp 18   Ht 5'  2 (1.575 m)   Wt 74.5 kg   LMP 02/11/2012   SpO2 100%   BMI 30.04 kg/m   Physical Exam Vitals and nursing note reviewed.  Constitutional:      General: She is not in acute distress.    Appearance: Normal appearance. She is not ill-appearing.  HENT:     Head: Normocephalic and atraumatic.     Nose: Nose normal.     Mouth/Throat:     Mouth: Mucous membranes are moist.  Eyes:     Extraocular Movements: Extraocular movements intact.     Conjunctiva/sclera: Conjunctivae normal.     Pupils: Pupils are equal, round, and reactive to light.  Cardiovascular:     Rate and Rhythm: Normal rate and regular rhythm.     Pulses: Normal pulses.     Heart sounds: Normal heart sounds. No murmur heard.    No gallop.  Pulmonary:     Effort: Pulmonary effort is normal. No respiratory distress.     Breath sounds: Normal breath sounds. No stridor. No wheezing, rhonchi or rales.  Abdominal:     General: Abdomen is flat. Bowel sounds are normal. There is no distension. There are no signs of injury.     Palpations: Abdomen is soft.     Tenderness: There is abdominal tenderness in the right  upper quadrant and epigastric area. Negative signs include McBurney's sign.     Hernia: No hernia is present.  Musculoskeletal:        General: Normal range of motion.     Cervical back: Normal range of motion and neck supple.  Skin:    General: Skin is warm and dry.     Findings: No erythema or rash.  Neurological:     General: No focal deficit present.     Mental Status: She is alert and oriented to person, place, and time. Mental status is at baseline.  Psychiatric:        Mood and Affect: Mood normal.        Behavior: Behavior normal.        Thought Content: Thought content normal.        Judgment: Judgment normal.     (all labs ordered are listed, but only abnormal results are displayed) Labs Reviewed  COMPREHENSIVE METABOLIC PANEL WITH GFR - Abnormal; Notable for the following components:       Result Value   Glucose, Bld 102 (*)    Calcium 8.8 (*)    All other components within normal limits  LIPASE, BLOOD  CBC WITH DIFFERENTIAL/PLATELET  URINALYSIS, ROUTINE W REFLEX MICROSCOPIC    EKG: None  Radiology: No results found.   Procedures   Medications Ordered in the ED  morphine (PF) 4 MG/ML injection 4 mg (has no administration in time range)  ondansetron  (ZOFRAN ) injection 4 mg (has no administration in time range)  sodium chloride  0.9 % bolus 1,000 mL (has no administration in time range)  ketorolac (TORADOL) 15 MG/ML injection 15 mg (has no administration in time range)                                    Medical Decision Making Amount and/or Complexity of Data Reviewed Labs: ordered. Radiology: ordered.  Risk Prescription drug management. Decision regarding hospitalization.   This patient presents to the ED for concern of abdominal pain, nausea, this involves an extensive number of treatment options, and is a complaint that carries with it a high risk of complications and morbidity.  The differential diagnosis includes acute appendicitis, cholecystitis, small bowel obstruction, diverticulitis, ovarian torsion or cyst, PID, tubo-ovarian abscess, pyelonephritis, kidney stone, pancreatitis, mesenteric ischemia, choledocholithiasis, cholangitis   Co morbidities that complicate the patient evaluation  MS, seizures   Additional history obtained:  Additional history obtained from none External records from outside source obtained and reviewed including none   Lab Tests:  I Ordered, and personally interpreted labs.  The pertinent results include: No leukocytosis, no anemia, normal kidney function liver function, normal electrolytes, negative lipase, urinalysis with moderate leukocytes   Imaging Studies ordered:  I ordered imaging studies including CT scan abdomen pelvis, right upper quadrant ultrasound I independently visualized and interpreted imaging  which showed cholelithiasis I agree with the radiologist interpretation    Consultations Obtained:  I requested consultation with the general surgery, Dr. Mavis,  and discussed lab and imaging findings as well as pertinent plan - they recommend: Admission, plan for OR tomorrow   Problem List / ED Course / Critical interventions / Medication management  Patient does remain stable at this time but continues to have ongoing right upper quadrant abdominal pain despite treatment in the emergency department.  Did discuss CT and ultrasound findings with Dr. Mavis with general surgery who is going to  evaluate the patient, plan for admission, plan for OR tomorrow.  She has been given Rocephin and Flagyl.  Urinalysis is concerning for urinary tract infection and urine has been sent.  Rocephin for coverage for this as well.  Patient has stable vital signs with no indication for sepsis.  She has no indication for choledocholithiasis or cholangitis. I ordered medication including Rocephin, Flagyl, Dilaudid , morphine, IV fluids, Toradol, Zofran  for cholelithiasis, urinary tract infection Reevaluation of the patient after these medicines showed that the patient improved I have reviewed the patients home medicines and have made adjustments as needed   Social Determinants of Health:  None   Test / Admission - Considered:  Admission     Final diagnoses:  None    ED Discharge Orders     None          Daralene Lonni JONETTA DEVONNA 02/16/24 1632    Patsey Lot, MD 02/17/24 [phone]

## 2024-02-16 NOTE — ED Notes (Signed)
 Patient transported to CT

## 2024-02-16 NOTE — H&P (Signed)
 Dawn Richard is an 53 y.o. female.   Chief Complaint: Right upper quadrant abdominal pain HPI: Patient is a 53 year old white female who presented to the emergency room with a 2-day history of worsening right upper quadrant abdominal pain and nausea.  She also is experiencing bloating.  She does not know any particular trigger food that caused this occurrence.  She states this is her first episode.  She denies any fever, chills, or jaundice.  A CT scan of the abdomen and pelvis revealed cholelithiasis.  Ultrasound confirmed cholelithiasis with a normal common bile duct.  She states that she did have pain to palpation in the right upper quadrant during the ultrasound.  Her pain has not been controlled in the emergency room.  Past Medical History:  Diagnosis Date   Anxiety    Arthritis    Chronic pain    Depression    Fibromyalgia    Gastroenteritis    Low back pain    Migraine    MS (multiple sclerosis)    Seizure (HCC)    last seizure 06/2016   Vitamin D deficiency     Past Surgical History:  Procedure Laterality Date   BACK SURGERY     CLOSED REDUCTION FINGER WITH PERCUTANEOUS PINNING Left 06/03/2018   Procedure: CLOSED REDUCTION and pinning left index FINGER;  Surgeon: Murrell Kuba, MD;  Location: [facility];  Service: Orthopedics;  Laterality: Left;   KNEE SURGERY Left     Family History  Problem Relation Age of Onset   High blood pressure Mother    High Cholesterol Mother    Heart disease Father    High blood pressure Father    Diabetes Maternal Grandmother    Diabetes Paternal Grandmother    Social History:  reports that she has never smoked. She has never been exposed to tobacco smoke. She has never used smokeless tobacco. She reports current alcohol use of about 1.0 standard drink of alcohol per week. She reports that she does not use drugs.  Allergies:  Allergies  Allergen Reactions   Bee Venom Anaphylaxis and Swelling   Tramadol  Other (See Comments)     Pt was instructed not to take due to possible inducing seizures    Doxycycline  Nausea Only    Sick    (Not in a hospital admission)   Results for orders placed or performed during the hospital encounter of 02/16/24 (from the past 48 hours)  Urinalysis, Routine w reflex microscopic -Urine, Clean Catch     Status: Abnormal   Collection Time: 02/16/24 11:22 AM  Result Value Ref Range   Color, Urine YELLOW YELLOW   APPearance CLEAR CLEAR   Specific Gravity, Urine 1.024 1.005 - 1.030   pH 5.0 5.0 - 8.0   Glucose, UA NEGATIVE NEGATIVE mg/dL   Hgb urine dipstick NEGATIVE NEGATIVE   Bilirubin Urine SMALL (A) NEGATIVE   Ketones, ur NEGATIVE NEGATIVE mg/dL   Protein, ur NEGATIVE NEGATIVE mg/dL   Nitrite NEGATIVE NEGATIVE   Leukocytes,Ua MODERATE (A) NEGATIVE   RBC / HPF 0-5 0 - 5 RBC/hpf   WBC, UA 21-50 0 - 5 WBC/hpf   Bacteria, UA NONE SEEN NONE SEEN   Squamous Epithelial / HPF 0-5 0 - 5 /HPF   Mucus PRESENT     Comment: Performed at [facility], [address], [place], [place] [address]  Lipase, blood     Status: None   Collection Time: 02/16/24 11:45 AM  Result Value Ref Range  Lipase 21 11 - 51 U/L    Comment: Performed at [facility], [address], [place], [place] [address]  Comprehensive metabolic panel     Status: Abnormal   Collection Time: 02/16/24 11:45 AM  Result Value Ref Range   Sodium 141 135 - 145 mmol/L   Potassium 4.4 3.5 - 5.1 mmol/L   Chloride 105 98 - 111 mmol/L   CO2 27 22 - 32 mmol/L   Glucose, Bld 102 (H) 70 - 99 mg/dL    Comment: Glucose reference range applies only to samples taken after fasting for at least 8 hours.   BUN 11 6 - 20 mg/dL   Creatinine, Ser 9.07 0.44 - 1.00 mg/dL   Calcium 8.8 (L) 8.9 - 10.3 mg/dL   Total Protein 6.6 6.5 - 8.1 g/dL   Albumin 4.2 3.5 - 5.0 g/dL   AST 17 15 - 41 U/L   ALT 8 0 - 44 U/L   Alkaline Phosphatase 99 38 - 126 U/L   Total Bilirubin 0.2 0.0 - 1.2 mg/dL   GFR, Estimated >39 >39 mL/min    Comment:  (NOTE) Calculated using the CKD-EPI Creatinine Equation (2021)    Anion gap 9 5 - 15    Comment: Performed at [facility], [address], [place], [place] [address]  CBC with Differential     Status: None   Collection Time: 02/16/24 11:45 AM  Result Value Ref Range   WBC 4.5 4.0 - 10.5 K/uL   RBC 4.12 3.87 - 5.11 MIL/uL   Hemoglobin 12.1 12.0 - 15.0 g/dL   HCT 62.6 63.9 - 53.9 %   MCV 90.5 80.0 - 100.0 fL   MCH 29.4 26.0 - 34.0 pg   MCHC 32.4 30.0 - 36.0 g/dL   RDW 87.6 88.4 - 84.4 %   Platelets 269 150 - 400 K/uL   nRBC 0.0 0.0 - 0.2 %   Neutrophils Relative % 63 %   Neutro Abs 2.8 1.7 - 7.7 K/uL   Lymphocytes Relative 27 %   Lymphs Abs 1.2 0.7 - 4.0 K/uL   Monocytes Relative 9 %   Monocytes Absolute 0.4 0.1 - 1.0 K/uL   Eosinophils Relative 0 %   Eosinophils Absolute 0.0 0.0 - 0.5 K/uL   Basophils Relative 1 %   Basophils Absolute 0.0 0.0 - 0.1 K/uL   Immature Granulocytes 0 %   Abs Immature Granulocytes 0.01 0.00 - 0.07 K/uL    Comment: Performed at [facility], [address] [place], [place] [address]   CT ABDOMEN PELVIS W CONTRAST Result Date: 02/16/2024 CLINICAL DATA:  Right-sided abdominal pain for 4 days EXAM: CT ABDOMEN AND PELVIS WITH CONTRAST TECHNIQUE: Multidetector CT imaging of the abdomen and pelvis was performed using the standard protocol following bolus administration of intravenous contrast. RADIATION DOSE REDUCTION: This exam was performed according to the departmental dose-optimization program which includes automated exposure control, adjustment of the mA and/or kV according to patient size and/or use of iterative reconstruction technique. CONTRAST:  OMNIPAQUE IOHEXOL 300 MG/ML  SOLN COMPARISON:  Ultrasound 02/16/2024 FINDINGS: Lower chest: No acute pleural or parenchymal lung disease. Hepatobiliary: Multiple small gallstones layer dependently in the gallbladder. No evidence of acute cholecystitis. The liver is unremarkable. No biliary duct dilation or  choledocholithiasis. Pancreas: Unremarkable. No pancreatic ductal dilatation or surrounding inflammatory changes. Spleen: Normal in size without focal abnormality. Adrenals/Urinary Tract: Adrenal glands are unremarkable. Kidneys are normal, without renal calculi, focal lesion, or hydronephrosis. Bladder is unremarkable. Stomach/Bowel:  No bowel obstruction or ileus. Normal appendix right lower quadrant. No bowel wall thickening or inflammatory change. Vascular/Lymphatic: No significant vascular findings are present. No enlarged abdominal or pelvic lymph nodes. Reproductive: Uterus and bilateral adnexa are unremarkable. Other: No free fluid or free intraperitoneal gas. No abdominal wall hernia. Musculoskeletal: No acute or destructive bony abnormalities. Postsurgical changes from L4-5 discectomy and fusion. Reconstructed images demonstrate no additional findings. IMPRESSION: 1. Cholelithiasis without cholecystitis. 2. Otherwise no acute intra-abdominal or intrapelvic process. Normal appendix. Electronically Signed   By: Ozell Daring M.D.   On: 02/16/2024 15:40   US  Abdomen Limited RUQ (LIVER/GB) Result Date: 02/16/2024 CLINICAL DATA:  Right upper quadrant pain 4 days. EXAM: ULTRASOUND ABDOMEN LIMITED RIGHT UPPER QUADRANT COMPARISON:  None Available. FINDINGS: Gallbladder: Moderate cholelithiasis with largest stone measuring 1.6 cm. No gallbladder wall thickening or adjacent fluid. Negative sonographic Murphy sign. Common bile duct: Diameter: 2.4 mm. Liver: No focal lesion identified. Within normal limits in parenchymal echogenicity. Portal vein is patent on color Doppler imaging with normal direction of blood flow towards the liver. Other: None. IMPRESSION: Moderate cholelithiasis without additional sonographic evidence to suggest cholecystitis. Electronically Signed   By: Toribio Agreste M.D.   On: 02/16/2024 14:58    Review of Systems  Constitutional:  Positive for fatigue.  HENT: Negative.    Eyes:  Negative.   Respiratory: Negative.    Cardiovascular: Negative.   Gastrointestinal:  Positive for abdominal pain and nausea.  Endocrine: Negative.   Genitourinary: Negative.   Musculoskeletal:  Positive for back pain.  Allergic/Immunologic: Negative.   Neurological: Negative.   Hematological: Negative.   Psychiatric/Behavioral: Negative.      Blood pressure 131/80, pulse 77, temperature 98 F (36.7 C), temperature source Oral, resp. rate 16, height 5' 2 (1.575 m), weight 74.5 kg, last menstrual period 02/11/2012, SpO2 100%. Physical Exam Vitals reviewed.  Constitutional:      Appearance: She is well-developed and normal weight. She is not ill-appearing.  HENT:     Head: Normocephalic and atraumatic.  Eyes:     General: No scleral icterus. Cardiovascular:     Rate and Rhythm: Normal rate and regular rhythm.     Heart sounds: Normal heart sounds. No murmur heard.    No friction rub. No gallop.  Pulmonary:     Effort: Pulmonary effort is normal. No respiratory distress.     Breath sounds: Normal breath sounds. No stridor. No wheezing, rhonchi or rales.  Abdominal:     General: Bowel sounds are decreased. There is no distension.     Palpations: Abdomen is soft.     Tenderness: There is abdominal tenderness in the right upper quadrant. There is no rebound. Positive signs include Murphy's sign.     Hernia: No hernia is present.  Skin:    General: Skin is warm and dry.  Neurological:     Mental Status: She is alert and oriented to person, place, and time.      Assessment/Plan Impression: Biliary colic secondary to cholelithiasis Plan: Patient will be admitted to the hospital under observation status for control of her pain and nausea.  She subsequently will undergo robotic assisted laparoscopic cholecystectomy.  The risks and benefits of the procedure including bleeding, infection, hepatobiliary injury, and the possibility of an open procedure were fully explained to the  patient, who gave informed consent.  Oneil Budge, MD 02/16/2024, 4:39 PM

## 2024-02-17 ENCOUNTER — Observation Stay (HOSPITAL_COMMUNITY): Admitting: Certified Registered Nurse Anesthetist

## 2024-02-17 ENCOUNTER — Encounter (HOSPITAL_COMMUNITY): Payer: Self-pay | Admitting: General Surgery

## 2024-02-17 ENCOUNTER — Encounter (HOSPITAL_COMMUNITY)
Admission: EM | Disposition: A | Discharge status: Home / Self Care | Payer: Self-pay | Source: Home / Self Care | Attending: Emergency Medicine

## 2024-02-17 DIAGNOSIS — R569 Unspecified convulsions: Secondary | ICD-10-CM

## 2024-02-17 DIAGNOSIS — F418 Other specified anxiety disorders: Secondary | ICD-10-CM

## 2024-02-17 DIAGNOSIS — K8070 Calculus of gallbladder and bile duct without cholecystitis without obstruction: Secondary | ICD-10-CM | POA: Diagnosis not present

## 2024-02-17 DIAGNOSIS — K8020 Calculus of gallbladder without cholecystitis without obstruction: Secondary | ICD-10-CM | POA: Diagnosis not present

## 2024-02-17 DIAGNOSIS — I10 Essential (primary) hypertension: Secondary | ICD-10-CM

## 2024-02-17 LAB — URINE CULTURE: Culture: <10000 — AB

## 2024-02-17 LAB — HIV ANTIBODY (ROUTINE TESTING W REFLEX): HIV Screen 4th Generation wRfx: NONREACTIVE

## 2024-02-17 SURGERY — CHOLECYSTECTOMY, ROBOT-ASSISTED, LAPAROSCOPIC
Anesthesia: General | Site: Abdomen

## 2024-02-17 MED ORDER — ONDANSETRON HCL 4 MG/2ML IJ SOLN: 4.0000 mg | Freq: Four times a day (QID) | INTRAMUSCULAR | Status: DC | PRN

## 2024-02-17 MED ORDER — SODIUM CHLORIDE 0.9 % IV SOLN: INTRAVENOUS | Status: DC

## 2024-02-17 MED ORDER — SUGAMMADEX SODIUM 200 MG/2ML IV SOLN
INTRAVENOUS | Status: DC | PRN
  Administered 2024-02-17: 200 mg via INTRAVENOUS

## 2024-02-17 MED ORDER — INDOCYANINE GREEN 25 MG IV SOLR
INTRAVENOUS | Status: AC
  Administered 2024-02-17: 2.5 mg via INTRAVENOUS
  Filled 2024-02-17: qty 10

## 2024-02-17 MED ORDER — OXYCODONE HCL 5 MG PO TABS: 5.0000 mg | ORAL_TABLET | Freq: Once | ORAL | Status: DC | PRN

## 2024-02-17 MED ORDER — SUCCINYLCHOLINE CHLORIDE 200 MG/10ML IV SOSY
PREFILLED_SYRINGE | INTRAVENOUS | Status: DC | PRN
  Administered 2024-02-17: 140 mg via INTRAVENOUS

## 2024-02-17 MED ORDER — ONDANSETRON HCL 4 MG/2ML IJ SOLN: 4.0000 mg | Freq: Once | INTRAMUSCULAR | Status: DC | PRN

## 2024-02-17 MED ORDER — LACTATED RINGERS IV SOLN: INTRAVENOUS | Status: DC

## 2024-02-17 MED ORDER — ACETAMINOPHEN 650 MG RE SUPP
650.0000 mg | Freq: Four times a day (QID) | RECTAL | Status: DC | PRN
Start: 2024-02-17 — End: 2024-02-17

## 2024-02-17 MED ORDER — SIMETHICONE 80 MG PO CHEW
40.0000 mg | CHEWABLE_TABLET | Freq: Four times a day (QID) | ORAL | Status: DC | PRN
  Administered 2024-02-17: 40 mg via ORAL
  Filled 2024-02-17: qty 1

## 2024-02-17 MED ORDER — BUPIVACAINE HCL (PF) 0.5 % IJ SOLN
INTRAMUSCULAR | Status: AC
  Filled 2024-02-17: qty 30

## 2024-02-17 MED ORDER — ONDANSETRON HCL 4 MG/2ML IJ SOLN
INTRAMUSCULAR | Status: DC | PRN
  Administered 2024-02-17: 4 mg via INTRAVENOUS

## 2024-02-17 MED ORDER — STERILE WATER FOR IRRIGATION IR SOLN
Status: DC | PRN
  Administered 2024-02-17: 500 mL

## 2024-02-17 MED ORDER — ROCURONIUM BROMIDE 10 MG/ML (PF) SYRINGE
PREFILLED_SYRINGE | INTRAVENOUS | Status: AC
  Filled 2024-02-17: qty 10

## 2024-02-17 MED ORDER — OXYCODONE HCL 5 MG PO TABS: 5.0000 mg | ORAL_TABLET | ORAL | Status: DC | PRN

## 2024-02-17 MED ORDER — KETOROLAC TROMETHAMINE 30 MG/ML IJ SOLN
INTRAMUSCULAR | Status: DC | PRN
  Administered 2024-02-17: 30 mg via INTRAVENOUS

## 2024-02-17 MED ORDER — SUCCINYLCHOLINE CHLORIDE 200 MG/10ML IV SOSY
PREFILLED_SYRINGE | INTRAVENOUS | Status: AC
  Filled 2024-02-17: qty 10

## 2024-02-17 MED ORDER — FENTANYL CITRATE (PF) 50 MCG/ML IJ SOSY: 25.0000 ug | PREFILLED_SYRINGE | INTRAMUSCULAR | Status: DC | PRN

## 2024-02-17 MED ORDER — LEVETIRACETAM 500 MG PO TABS
750.0000 mg | ORAL_TABLET | Freq: Two times a day (BID) | ORAL | Status: DC
  Administered 2024-02-17: 750 mg via ORAL
  Filled 2024-02-17: qty 1

## 2024-02-17 MED ORDER — PROPOFOL 10 MG/ML IV BOLUS
INTRAVENOUS | Status: AC
Start: 2024-02-17 — End: 2024-02-17
  Filled 2024-02-17: qty 20

## 2024-02-17 MED ORDER — BUPIVACAINE HCL (PF) 0.5 % IJ SOLN
INTRAMUSCULAR | Status: DC | PRN
  Administered 2024-02-17: 30 mL

## 2024-02-17 MED ORDER — FENTANYL CITRATE (PF) 100 MCG/2ML IJ SOLN
INTRAMUSCULAR | Status: DC | PRN
  Administered 2024-02-17: 50 ug via INTRAVENOUS
  Administered 2024-02-17: 100 ug via INTRAVENOUS

## 2024-02-17 MED ORDER — LIDOCAINE 2% (20 MG/ML) 5 ML SYRINGE
INTRAMUSCULAR | Status: AC
  Filled 2024-02-17: qty 5

## 2024-02-17 MED ORDER — LIDOCAINE 2% (20 MG/ML) 5 ML SYRINGE
INTRAMUSCULAR | Status: DC | PRN
Start: 2024-02-17 — End: 2024-02-17
  Administered 2024-02-17: 60 mg via INTRAVENOUS

## 2024-02-17 MED ORDER — ONDANSETRON HCL 4 MG/2ML IJ SOLN
INTRAMUSCULAR | Status: AC
  Filled 2024-02-17: qty 2

## 2024-02-17 MED ORDER — METHOCARBAMOL 500 MG PO TABS: 500.0000 mg | ORAL_TABLET | Freq: Four times a day (QID) | ORAL | Status: DC | PRN

## 2024-02-17 MED ORDER — ROCURONIUM BROMIDE 10 MG/ML (PF) SYRINGE
PREFILLED_SYRINGE | INTRAVENOUS | Status: DC | PRN
  Administered 2024-02-17: 50 mg via INTRAVENOUS

## 2024-02-17 MED ORDER — MIDAZOLAM HCL (PF) 2 MG/2ML IJ SOLN
INTRAMUSCULAR | Status: DC | PRN
  Administered 2024-02-17: 2 mg via INTRAVENOUS

## 2024-02-17 MED ORDER — HYDROMORPHONE HCL 1 MG/ML IJ SOLN
1.0000 mg | INTRAMUSCULAR | Status: DC | PRN
  Administered 2024-02-17: 1 mg via INTRAVENOUS
  Filled 2024-02-17: qty 1

## 2024-02-17 MED ORDER — OXYCODONE HCL 5 MG/5ML PO SOLN: 5.0000 mg | Freq: Once | ORAL | Status: DC | PRN

## 2024-02-17 MED ORDER — OXYCODONE HCL 5 MG PO TABS: 5.0000 mg | ORAL_TABLET | ORAL | 0 refills | Status: AC | PRN

## 2024-02-17 MED ORDER — PROPOFOL 10 MG/ML IV BOLUS
INTRAVENOUS | Status: DC | PRN
  Administered 2024-02-17: 20 mg via INTRAVENOUS
  Administered 2024-02-17: 180 mg via INTRAVENOUS

## 2024-02-17 MED ORDER — ONDANSETRON 4 MG PO TBDP: 4.0000 mg | ORAL_TABLET | Freq: Four times a day (QID) | ORAL | Status: DC | PRN

## 2024-02-17 MED ORDER — CHLORHEXIDINE GLUCONATE 0.12 % MT SOLN
15.0000 mL | Freq: Once | OROMUCOSAL | Status: AC
  Administered 2024-02-17: 15 mL via OROMUCOSAL

## 2024-02-17 MED ORDER — HEMOSTATIC AGENTS (NO CHARGE) OPTIME
TOPICAL | Status: DC | PRN
Start: 2024-02-17 — End: 2024-02-17
  Administered 2024-02-17: 1 via TOPICAL

## 2024-02-17 MED ORDER — FENTANYL CITRATE (PF) 250 MCG/5ML IJ SOLN
INTRAMUSCULAR | Status: AC
  Filled 2024-02-17: qty 5

## 2024-02-17 MED ORDER — PHENYLEPHRINE 80 MCG/ML (10ML) SYRINGE FOR IV PUSH (FOR BLOOD PRESSURE SUPPORT)
PREFILLED_SYRINGE | INTRAVENOUS | Status: DC | PRN
  Administered 2024-02-17: 80 ug via INTRAVENOUS

## 2024-02-17 MED ORDER — MIDAZOLAM HCL 2 MG/2ML IJ SOLN
INTRAMUSCULAR | Status: AC
  Filled 2024-02-17: qty 2

## 2024-02-17 MED ORDER — ACETAMINOPHEN 325 MG PO TABS: 650.0000 mg | ORAL_TABLET | Freq: Four times a day (QID) | ORAL | Status: DC | PRN

## 2024-02-17 SURGICAL SUPPLY — 33 items
CAUTERY HOOK MNPLR 1.6 DVNC XI (INSTRUMENTS) ×2 IMPLANT
CHLORAPREP W/TINT 26 (MISCELLANEOUS) ×2 IMPLANT
CLIP LIGATING HEM O LOK PURPLE (MISCELLANEOUS) ×2 IMPLANT
COVER LIGHT HANDLE STERIS (MISCELLANEOUS) ×2 IMPLANT
DERMABOND ADVANCED .7 DNX12 (GAUZE/BANDAGES/DRESSINGS) ×2 IMPLANT
DRAPE ARM DVNC X/XI (DISPOSABLE) ×8 IMPLANT
DRAPE COLUMN DVNC XI (DISPOSABLE) ×2 IMPLANT
ELECTRODE REM PT RTRN 9FT ADLT (ELECTROSURGICAL) ×2 IMPLANT
FORCEPS BPLR R/ABLATION 8 DVNC (INSTRUMENTS) ×2 IMPLANT
FORCEPS PROGRASP DVNC XI (FORCEP) ×2 IMPLANT
GLOVE BIOGEL PI IND STRL 7.0 (GLOVE) ×4 IMPLANT
GLOVE SURG SS PI 7.5 STRL IVOR (GLOVE) ×4 IMPLANT
GOWN STRL REUS W/TWL LRG LVL3 (GOWN DISPOSABLE) ×6 IMPLANT
HEMOSTAT SNOW SURGICEL 2X4 (HEMOSTASIS) IMPLANT
IRRIGATOR SUCT 8 DISP DVNC XI (IRRIGATION / IRRIGATOR) IMPLANT
KIT TURNOVER KIT A (KITS) ×2 IMPLANT
MANIFOLD NEPTUNE II (INSTRUMENTS) ×2 IMPLANT
NDL HYPO 21X1.5 SAFETY (NEEDLE) ×2 IMPLANT
NDL INSUFFLATION 14GA 120MM (NEEDLE) ×2 IMPLANT
NEEDLE HYPO 21X1.5 SAFETY (NEEDLE) ×1 IMPLANT
NEEDLE INSUFFLATION 14GA 120MM (NEEDLE) ×1 IMPLANT
OBTURATOR OPTICALSTD 8 DVNC (TROCAR) ×2 IMPLANT
PACK LAP CHOLE LZT030E (CUSTOM PROCEDURE TRAY) ×2 IMPLANT
PAD ARMBOARD POSITIONER FOAM (MISCELLANEOUS) ×2 IMPLANT
PENCIL HANDSWITCHING (ELECTRODE) ×2 IMPLANT
POSITIONER HEAD 8X9X4 ADT (SOFTGOODS) ×2 IMPLANT
SEAL UNIV 5-12 XI (MISCELLANEOUS) ×8 IMPLANT
SET BASIN LINEN APH (SET/KITS/TRAYS/PACK) ×2 IMPLANT
SET TUBE DA VINCI INSUFFLATOR (TUBING) IMPLANT
SUT MNCRL AB 4-0 PS2 18 (SUTURE) ×4 IMPLANT
SYR 30ML LL (SYRINGE) ×2 IMPLANT
SYSTEM RETRIEVL 5MM INZII UNIV (BASKET) ×2 IMPLANT
WATER STERILE IRR 500ML POUR (IV SOLUTION) ×2 IMPLANT

## 2024-02-17 NOTE — Discharge Summary (Signed)
 Physician Discharge Summary  Patient ID: Dawn Richard MRN: [id number] DOB/AGE: 05-27-1970 53 y.o.  Admit date: 02/16/2024 Discharge date: 02/17/2024  Admission Diagnoses: Right upper quadrant abdominal pain  Discharge Diagnoses: Same Principal Problem:   Cholelithiasis Active Problems:   Calculus of gallbladder without cholecystitis without obstruction   Discharged Condition: good  Hospital Course: Patient is a 53 year old white female who presented the emergency room with worsening right upper quadrant abdominal pain.  CT scan of the abdomen and pelvis revealed cholelithiasis.  Ultrasound confirmed cholelithiasis with a normal common bile duct.  She was admitted to the hospital for control of her pain and nausea.  She subsequently underwent a robotic assisted laparoscopic cholecystectomy on 02/17/2024.  She tolerated surgery well.  Her postoperative course has been unremarkable.  She has been discharged home on 02/17/2024 in good and improving condition.  Treatments: surgery: Robotic assisted laparoscopic cholecystectomy on 02/17/2024  Discharge Exam: Blood pressure 119/74, pulse 82, temperature 98.9 F (37.2 C), temperature source Oral, resp. rate 16, height 5' 2 (1.575 m), weight 74.5 kg, last menstrual period 02/11/2012, SpO2 95%. General appearance: alert, cooperative, and no distress Resp: clear to auscultation bilaterally Cardio: regular rate and rhythm, S1, S2 normal, no murmur, click, rub or gallop GI: Soft, incisions healing well.  Disposition: Discharge disposition: 01-Home or Self Care       Discharge Instructions     Diet - low sodium heart healthy   Complete by: As directed    Increase activity slowly   Complete by: As directed       Allergies as of 02/17/2024       Reactions   Bee Venom Anaphylaxis, Swelling   Tramadol  Other (See Comments)   Pt was instructed not to take due to possible inducing seizures    Doxycycline  Nausea Only   Sick         Medication List     STOP taking these medications    gabapentin  600 MG tablet Commonly known as: NEURONTIN        TAKE these medications    DULoxetine  HCl 40 MG Cpep Take 40 mg by mouth 2 (two) times daily.   levETIRAcetam  750 MG tablet Commonly known as: KEPPRA  Take 750 mg by mouth 2 (two) times daily.   Melatonin 10 MG Tabs Take 1 tablet by mouth at bedtime.   methocarbamol 500 MG tablet Commonly known as: ROBAXIN Take 500 mg by mouth 2 (two) times daily.   nabumetone  750 MG tablet Commonly known as: RELAFEN  Take 750 mg by mouth 2 (two) times daily.   naproxen sodium 220 MG tablet Commonly known as: ALEVE Take 440 mg by mouth daily as needed (pain).   omeprazole 20 MG capsule Commonly known as: PRILOSEC Take 20 mg by mouth daily.   ondansetron  4 MG tablet Commonly known as: ZOFRAN  Take 4 mg by mouth every 8 (eight) hours as needed for nausea or vomiting.   oxyCODONE  5 MG immediate release tablet Commonly known as: Roxicodone  Take 1 tablet (5 mg total) by mouth every 4 (four) hours as needed.        Follow-up Information     Mavis Anes, MD Follow up.   Specialty: General Surgery Why: As needed.  Will call you next week for follow up. Contact information: [address] [place] [address] [phone]                 Signed: Anes Mavis 02/17/2024, 2:14 PM

## 2024-02-17 NOTE — Progress Notes (Signed)
 RN transported patient to room 308.  Patient in no apparent distress.  Tolerated procedure well.

## 2024-02-17 NOTE — Transfer of Care (Signed)
 Immediate Anesthesia Transfer of Care Note  Patient: Dawn Richard  Procedure(s) Performed: CHOLECYSTECTOMY, ROBOT-ASSISTED, LAPAROSCOPIC (Abdomen)  Patient Location: PACU  Anesthesia Type:General  Level of Consciousness: awake and patient cooperative  Airway & Oxygen Therapy: Patient Spontanous Breathing and Patient connected to nasal cannula oxygen  Post-op Assessment: Report given to RN and Post -op Vital signs reviewed and stable  Post vital signs: Reviewed and stable  Last Vitals:  Vitals Value Taken Time  BP 123/71 02/17/24 10:33  Temp 97.7   Pulse 97 02/17/24 10:35  Resp 8 02/17/24 10:35  SpO2 97 % 02/17/24 10:35  Vitals shown include unfiled device data.  Last Pain:  Vitals:   02/17/24 0814  TempSrc: Oral  PainSc: 7       Patients Stated Pain Goal: 4 (02/17/24 9185)  Complications: No notable events documented.

## 2024-02-17 NOTE — Op Note (Signed)
 Patient:  Dawn Richard  DOB:  1970-06-20  MRN:  [id number]   Preop Diagnosis: Biliary colic, cholelithiasis  Postop Diagnosis: Same  Procedure: Robotic assisted laparoscopic cholecystectomy  Surgeon: Oneil Budge, MD  Anes: General endotracheal  Indications: Patient is a 53 year old white female who presents with biliary colic secondary to cholelithiasis.  The risks and benefits of the procedure including bleeding, infection, hepatobiliary injury, bowel injury, and the possibility of an open procedure were fully explained to the patient, who gave informed consent.  Procedure note: The patient was placed in the supine position.  After induction of general endotracheal anesthesia, the abdomen was prepped and draped using the usual sterile technique with ChloraPrep.  Surgical site confirmation was performed.  An infraumbilical incision was made down to the fascia.  A Veress needle was introduced into the abdominal cavity and confirmation of placement was done using the saline drop test.  The abdomen was then insufflated to 15 mmHg pressure.  An 8 mm trocar was introduced into the abdominal cavity under direct visualization without difficulty.  The patient was placed in reverse Trendelenburg position and additional 8 mm trocars were placed in the left upper quadrant, right lower quadrant, and right flank regions.  The robot was then docked and targeted.  The liver was inspected noted to be within normal limits.  The patient had an elongated gallbladder.  It was retracted in a dynamic fashion in order to provide a critical view of the triangle of Calot.  The cystic duct was first identified.  Its juncture to the infundibulum was fully identified.  Hem-o-lok clips were placed proximally distally on the cystic duct and the cystic duct was divided.  This was likewise done the cystic artery.  The gallbladder was freed away from the gallbladder fossa using Bovie electrocautery.  The gallbladder was  delivered through the left upper quadrant trocar site without difficulty.  The gallbladder fossa was inspected and no abnormal bleeding or bile leakage was noted.  Surgicel snow was placed in the gallbladder fossa.  The robot was undocked.  All fluid and air were then evacuated from the abdominal cavity prior to the removal of the trocars.  All wounds were irrigated with normal saline.  All wounds were injected with 0.5% Sensorcaine .  All incisions were closed using a 4-0 Monocryl subcuticular suture.  Dermabond was applied.  All tape and needle counts were correct at the end of the procedure.  The patient was extubated in the operating room and transferred to PACU in stable condition.  Complications: None  EBL: 50 cc  Specimen: Gallbladder

## 2024-02-17 NOTE — Interval H&P Note (Signed)
 History and Physical Interval Note:  02/17/2024 8:38 AM  Dawn Richard  has presented today for surgery, with the diagnosis of biliary colic, cholelithiasis.  The various methods of treatment have been discussed with the patient and family. After consideration of risks, benefits and other options for treatment, the patient has consented to  Procedure(s): CHOLECYSTECTOMY, ROBOT-ASSISTED, LAPAROSCOPIC (N/A) as a surgical intervention.  The patient's history has been reviewed, patient examined, no change in status, stable for surgery.  I have reviewed the patient's chart and labs.  Questions were answered to the patient's satisfaction.     Oneil Budge

## 2024-02-17 NOTE — Anesthesia Procedure Notes (Signed)
 Procedure Name: Intubation Date/Time: 02/17/2024 9:28 AM  Performed by: Elaine Delon CROME, CRNAPre-anesthesia Checklist: Patient identified, Emergency Drugs available, Suction available and Patient being monitored Patient Re-evaluated:Patient Re-evaluated prior to induction Oxygen Delivery Method: Circle system utilized Preoxygenation: Pre-oxygenation with 100% oxygen Induction Type: IV induction, Rapid sequence and Cricoid Pressure applied Laryngoscope Size: Mac and 3 Grade View: Grade I Tube type: Oral Tube size: 7.0 mm Number of attempts: 1 Airway Equipment and Method: Stylet and Oral airway Placement Confirmation: ETT inserted through vocal cords under direct vision, positive ETCO2 and breath sounds checked- equal and bilateral Secured at: 21 cm Tube secured with: Tape Dental Injury: Teeth and Oropharynx as per pre-operative assessment

## 2024-02-17 NOTE — TOC CM/SW Note (Signed)
 Transition of Care [facility]) - Inpatient Brief Assessment   Patient Details  Name: Dawn Richard MRN: [id number] Date of Birth: 06/29/1970  Transition of Care [facility]) CM/SW Contact:    Noreen KATHEE Pinal, LCSWA Phone Number: 02/17/2024, 10:20 AM   Clinical Narrative:  Inpatient Care Management (ICM) has reviewed patient and no other ICM needs have been identified at this time. We will continue to monitor patient advancement through interdisciplinary progression rounds. If new patient transition needs arise, please place a ICM consult.   Transition of Care Asessment: Insurance and Status: Insurance coverage has been reviewed Patient has primary care physician: Yes Home environment has been reviewed: Single Family Home Prior level of function:: Independent Prior/Current Home Services: No current home services Social Drivers of Health Review: SDOH reviewed no interventions necessary Readmission risk has been reviewed: Yes Transition of care needs: no transition of care needs at this time

## 2024-02-17 NOTE — Anesthesia Preprocedure Evaluation (Signed)
 Anesthesia Evaluation  Patient identified by MRN, date of birth, ID band Patient awake    Reviewed: Allergy & Precautions, H&P , NPO status , Patient's Chart, lab work & pertinent test results, reviewed documented beta blocker date and time   Airway Mallampati: II  TM Distance: >3 FB Neck ROM: full    Dental no notable dental hx.    Pulmonary neg pulmonary ROS   Pulmonary exam normal breath sounds clear to auscultation       Cardiovascular Exercise Tolerance: Good hypertension, negative cardio ROS  Rhythm:regular Rate:Normal     Neuro/Psych  Headaches, Seizures -,  PSYCHIATRIC DISORDERS Anxiety Depression     Neuromuscular disease    GI/Hepatic Neg liver ROS,GERD  ,,  Endo/Other  negative endocrine ROS    Renal/GU negative Renal ROS  negative genitourinary   Musculoskeletal   Abdominal   Peds  Hematology  (+) Blood dyscrasia, anemia   Anesthesia Other Findings   Reproductive/Obstetrics negative OB ROS                              Anesthesia Physical Anesthesia Plan  ASA: 3  Anesthesia Plan: General and General ETT   Post-op Pain Management:    Induction:   PONV Risk Score and Plan: Ondansetron   Airway Management Planned:   Additional Equipment:   Intra-op Plan:   Post-operative Plan:   Informed Consent: I have reviewed the patients History and Physical, chart, labs and discussed the procedure including the risks, benefits and alternatives for the proposed anesthesia with the patient or authorized representative who has indicated his/her understanding and acceptance.     Dental Advisory Given  Plan Discussed with: CRNA  Anesthesia Plan Comments:         Anesthesia Quick Evaluation

## 2024-02-17 NOTE — Plan of Care (Signed)

## 2024-02-17 NOTE — Plan of Care (Signed)
   Problem: Activity: Goal: Risk for activity intolerance will decrease Outcome: Progressing   Problem: Coping: Goal: Level of anxiety will decrease Outcome: Progressing

## 2024-02-17 NOTE — Progress Notes (Signed)
 Patient returned to room 308 alert and oriented, lap sites clean, dry, and intact, vital signs stable.

## 2024-02-18 LAB — SURGICAL PATHOLOGY

## 2024-02-18 NOTE — Anesthesia Postprocedure Evaluation (Signed)
 Anesthesia Post Note  Patient: Dawn Richard  Procedure(s) Performed: CHOLECYSTECTOMY, ROBOT-ASSISTED, LAPAROSCOPIC (Abdomen)  Patient location during evaluation: Phase II Anesthesia Type: General Level of consciousness: awake Pain management: pain level controlled Vital Signs Assessment: post-procedure vital signs reviewed and stable Respiratory status: spontaneous breathing and respiratory function stable Cardiovascular status: blood pressure returned to baseline and stable Postop Assessment: no headache and no apparent nausea or vomiting Anesthetic complications: no Comments: Late entry   No notable events documented.   Last Vitals:  Vitals:   02/17/24 1100 02/17/24 1118  BP: 113/71 119/74  Pulse: 85 82  Resp: 12 16  Temp: 36.4 C 37.2 C  SpO2: 96% 95%    Last Pain:  Vitals:   02/17/24 1222  TempSrc:   PainSc: Asleep                 Yvonna JINNY Bosworth

## 2024-02-23 ENCOUNTER — Ambulatory Visit (INDEPENDENT_AMBULATORY_CARE_PROVIDER_SITE_OTHER): Admitting: General Surgery

## 2024-02-23 ENCOUNTER — Encounter: Payer: Self-pay | Admitting: General Surgery

## 2024-02-23 DIAGNOSIS — Z09 Encounter for follow-up examination after completed treatment for conditions other than malignant neoplasm: Secondary | ICD-10-CM

## 2024-02-23 NOTE — Progress Notes (Signed)
 Attempted postoperative telephone visit with patient.  Left message with patient to call me should she have any problems.
